# Patient Record
Sex: Male | Born: 2000 | Race: Black or African American | Hispanic: Yes | Marital: Single | Smoking: Never smoker
Health system: Southern US, Community
[De-identification: ages and names within clinical notes are randomized; demographics above are authoritative.]

## PROBLEM LIST (undated history)

## (undated) DIAGNOSIS — R51 Headache: Secondary | ICD-10-CM

## (undated) DIAGNOSIS — R011 Cardiac murmur, unspecified: Secondary | ICD-10-CM

## (undated) DIAGNOSIS — J45909 Unspecified asthma, uncomplicated: Secondary | ICD-10-CM

## (undated) DIAGNOSIS — G43909 Migraine, unspecified, not intractable, without status migrainosus: Secondary | ICD-10-CM

## (undated) HISTORY — PX: CIRCUMCISION: SUR203

## (undated) HISTORY — DX: Headache: R51

## (undated) HISTORY — DX: Cardiac murmur, unspecified: R01.1

---
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
## 2019-05-18 ENCOUNTER — Encounter (HOSPITAL_COMMUNITY): Payer: Self-pay | Admitting: Student

## 2019-05-18 ENCOUNTER — Other Ambulatory Visit: Payer: Self-pay

## 2019-05-18 ENCOUNTER — Emergency Department (HOSPITAL_COMMUNITY)
Admission: EM | Admit: 2019-05-18 | Discharge: 2019-05-18 | Disposition: A | Discharge status: Home / Self Care | Payer: BC Managed Care – PPO | Attending: Emergency Medicine | Admitting: Emergency Medicine

## 2019-05-18 ENCOUNTER — Emergency Department (HOSPITAL_COMMUNITY): Payer: BC Managed Care – PPO

## 2019-05-18 DIAGNOSIS — Z20828 Contact with and (suspected) exposure to other viral communicable diseases: Secondary | ICD-10-CM | POA: Insufficient documentation

## 2019-05-18 DIAGNOSIS — J45909 Unspecified asthma, uncomplicated: Secondary | ICD-10-CM | POA: Diagnosis not present

## 2019-05-18 DIAGNOSIS — B349 Viral infection, unspecified: Secondary | ICD-10-CM | POA: Insufficient documentation

## 2019-05-18 DIAGNOSIS — Z79899 Other long term (current) drug therapy: Secondary | ICD-10-CM | POA: Diagnosis not present

## 2019-05-18 DIAGNOSIS — R519 Headache, unspecified: Secondary | ICD-10-CM | POA: Diagnosis present

## 2019-05-18 LAB — COMPREHENSIVE METABOLIC PANEL
ALT: 19 U/L (ref 0–44)
AST: 24 U/L (ref 15–41)
Albumin: 4.8 g/dL (ref 3.5–5.0)
Alkaline Phosphatase: 102 U/L (ref 38–126)
Anion gap: 8 (ref 5–15)
BUN: 12 mg/dL (ref 6–20)
CO2: 28 mmol/L (ref 22–32)
Calcium: 9.8 mg/dL (ref 8.9–10.3)
Chloride: 101 mmol/L (ref 98–111)
Creatinine, Ser: 0.87 mg/dL (ref 0.61–1.24)
GFR calc Af Amer: >60 mL/min (ref >60)
GFR calc non Af Amer: >60 mL/min (ref >60)
Glucose, Bld: 90 mg/dL (ref 70–99)
Potassium: 3.7 mmol/L (ref 3.5–5.1)
Sodium: 137 mmol/L (ref 135–145)
Total Bilirubin: 1.1 mg/dL (ref 0.3–1.2)
Total Protein: 8.1 g/dL (ref 6.5–8.1)

## 2019-05-18 LAB — CBC WITH DIFFERENTIAL/PLATELET
Abs Immature Granulocytes: 0.01 10*3/uL (ref 0.00–0.07)
Basophils Absolute: 0 10*3/uL (ref 0.0–0.1)
Basophils Relative: 0 %
Eosinophils Absolute: 0.1 10*3/uL (ref 0.0–0.5)
Eosinophils Relative: 1 %
HCT: 48.5 % (ref 39.0–52.0)
Hemoglobin: 15.1 g/dL (ref 13.0–17.0)
Immature Granulocytes: 0 %
Lymphocytes Relative: 18 %
Lymphs Abs: 1.3 10*3/uL (ref 0.7–4.0)
MCH: 23.3 pg — ABNORMAL LOW (ref 26.0–34.0)
MCHC: 31.1 g/dL (ref 30.0–36.0)
MCV: 75 fL — ABNORMAL LOW (ref 80.0–100.0)
Monocytes Absolute: 0.5 10*3/uL (ref 0.1–1.0)
Monocytes Relative: 7 %
Neutro Abs: 5 10*3/uL (ref 1.7–7.7)
Neutrophils Relative %: 74 %
Platelets: 203 10*3/uL (ref 150–400)
RBC: 6.47 MIL/uL — ABNORMAL HIGH (ref 4.22–5.81)
RDW: 13.7 % (ref 11.5–15.5)
WBC: 6.8 10*3/uL (ref 4.0–10.5)
nRBC: 0 % (ref 0.0–0.2)

## 2019-05-18 MED ORDER — ALBUTEROL SULFATE HFA 108 (90 BASE) MCG/ACT IN AERS
2.0000 | INHALATION_SPRAY | Freq: Once | RESPIRATORY_TRACT | Status: AC
  Administered 2019-05-18: 2 via RESPIRATORY_TRACT
  Filled 2019-05-18: qty 6.7

## 2019-05-18 MED ORDER — ACETAMINOPHEN 325 MG PO TABS
650.0000 mg | ORAL_TABLET | Freq: Once | ORAL | Status: DC
  Filled 2019-05-18: qty 2

## 2019-05-18 MED ORDER — DEXAMETHASONE SODIUM PHOSPHATE 10 MG/ML IJ SOLN
10.0000 mg | Freq: Once | INTRAMUSCULAR | Status: AC
  Administered 2019-05-18: 10 mg via INTRAVENOUS
  Filled 2019-05-18: qty 1

## 2019-05-18 MED ORDER — SODIUM CHLORIDE 0.9 % IV BOLUS
500.0000 mL | Freq: Once | INTRAVENOUS | Status: AC
  Administered 2019-05-18: 500 mL via INTRAVENOUS

## 2019-05-18 MED ORDER — AEROCHAMBER PLUS FLO-VU MEDIUM MISC
1.0000 | Freq: Once | Status: AC
  Administered 2019-05-18: 15:00:00 1
  Filled 2019-05-18: qty 1

## 2019-05-18 MED ORDER — ONDANSETRON HCL 4 MG/2ML IJ SOLN
4.0000 mg | Freq: Once | INTRAMUSCULAR | Status: AC
  Administered 2019-05-18: 15:00:00 4 mg via INTRAVENOUS
  Filled 2019-05-18: qty 2

## 2019-05-18 MED ORDER — ONDANSETRON 4 MG PO TBDP: 4.0000 mg | ORAL_TABLET | Freq: Three times a day (TID) | ORAL | 0 refills | Status: DC | PRN

## 2019-05-18 MED ORDER — AEROCHAMBER PLUS FLO-VU LARGE MISC
Status: AC
  Administered 2019-05-18: 1
  Filled 2019-05-18: qty 1

## 2019-05-18 NOTE — ED Notes (Signed)
Patient refused strep swab

## 2019-05-18 NOTE — ED Triage Notes (Signed)
Pt here for evaluation of three days of headache and sore throat, some diarrhea yesterday. Pt sts his mother had covid earlier this month and his job has informed them of positive cases.

## 2019-05-18 NOTE — ED Notes (Signed)
Pt tolerating fluids.   

## 2019-05-18 NOTE — ED Provider Notes (Signed)
[facility]   CSN: [id number] Arrival date & time: 05/18/19  1251     History   Chief Complaint "Covid symptoms"   HPI Terrence [name] is a 18 y.o. male with a hx of asthma & migraines who presents to the ED with complaints of "covid symptoms" that began 3 days prior. Patient reports congestion, nasal congestion, headaches (gradual onset similar to prior), sore throat, productive cough, mild dyspnea at times, nausea, 3 episodes of emesis, & a few episodes of diarrhea. Somewhat poor appetite/PO intake. No alleviating/aggravating factors. He is concerned for COVID 19 as his mother had this earlier in the month and he was around her, there were also some positive cases at his place of work. Denies fever, ear pain, chest pain, abdominal pain, hematemesis, melena, hematochezia, dysuria, or body aches. Denies wheezing, states this does not feel like his prior asthma issues, has not used his inhaler.     HPI  Past Medical History:  Diagnosis Date  . Asthma   . Headache(784.0)   . Heart murmur    has history of PFO  . Migraines     Patient Active Problem List   Diagnosis Date Noted  . History of asthma 11/22/2013  . Migraine without aura and without status migrainosus, not intractable 11/23/2012  . Tension headache 11/23/2012  . Heart murmur 11/23/2012  . 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 CIRCUMCISION    . MOUTH SURGERY  2008        Home Medications    Prior to Admission medications   Medication Sig Start Date End Date Taking? Authorizing Provider  acetaminophen (TYLENOL) 325 MG tablet Take 650 mg by mouth every 4 (four) hours as needed.     [provider]  albuterol (PROVENTIL HFA;VENTOLIN HFA) 108 (90 BASE) MCG/ACT inhaler Inhale 2 puffs into the lungs every 6 (six) hours as needed for wheezing.    [provider]  cetirizine (ZYRTEC) 1  MG/ML syrup Take 10 mg by mouth daily.  11/01/12   [provider]  divalproex (DEPAKOTE SPRINKLE) 125 MG capsule Take 4 capsules by mouth in the evening 04/30/16   Elveria RisingGoodpasture, Tina, NP  fluticasone (FLONASE) 50 MCG/ACT nasal spray Place 1 spray into both nostrils daily. Instill 2 sprays in each nostril daily 11/01/12   [provider]  ibuprofen (CHILDRENS IBUPROFEN) 100 MG/5ML suspension Take 400 mg by mouth every 8 (eight) hours.  07/06/13   [provider]  ondansetron (ZOFRAN ODT) 4 MG disintegrating tablet Place 1 tablet under the tongue every 8 hours as needed for nausea & vomiting 04/30/16   Elveria RisingGoodpasture, Tina, NP  SUMAtriptan (IMITREX) 5 MG/ACT nasal spray Place 1 spray (5 mg total) into the nose every 2 (two) hours as needed. 04/30/16   Elveria RisingGoodpasture, Tina, NP    Family History Family History  Problem Relation Age of Onset  . Migraines Father   . Autism Cousin        3 Paternal Cousins have Autism    Social History Social History   Tobacco Use  . Smoking status: Never Smoker  . Smokeless tobacco: Never Used  Substance Use Topics  . Alcohol use: No  . Drug use: No     Allergies   Patient has no known allergies.   Review of Systems Review of Systems  Constitutional: Positive for appetite change and chills. Negative for fever.  HENT: Positive for  congestion and sore throat. Negative for ear pain.   Respiratory: Positive for cough and shortness of breath. Negative for wheezing.   Cardiovascular: Negative for chest pain and leg swelling.  Gastrointestinal: Positive for diarrhea, nausea and vomiting. Negative for abdominal pain, anal bleeding, blood in stool and constipation.  Genitourinary: Negative for dysuria.  Musculoskeletal: Negative for myalgias.  All other systems reviewed and are negative.  Physical Exam Updated Vital Signs BP (!) 131/59 (BP Location: Right Arm)   Pulse 62   Temp 98.2 F (36.8 C) (Oral)   Resp 20   SpO2 99%    Physical Exam Vitals signs and nursing note reviewed.  Constitutional:      General: He is not in acute distress.    Appearance: He is well-developed. He is not toxic-appearing.  HENT:     Head: Normocephalic and atraumatic.     Right Ear: Ear canal normal. Tympanic membrane is not perforated, erythematous, retracted or bulging.     Left Ear: Ear canal normal. Tympanic membrane is not perforated, erythematous, retracted or bulging.     Ears:     Comments: No mastoid erythema/swelling/tenderness.     Nose: Congestion present.     Right Sinus: No maxillary sinus tenderness or frontal sinus tenderness.     Left Sinus: No maxillary sinus tenderness or frontal sinus tenderness.     Mouth/Throat:     Pharynx: Uvula midline. Posterior oropharyngeal erythema present. No uvula swelling.     Tonsils: No tonsillar exudate. 3+ on the right. 3+ on the left.     Comments: Posterior oropharynx is symmetric appearing. Patient tolerating own secretions without difficulty. No trismus. No drooling. No hot potato voice. No swelling beneath the tongue, submandibular compartment is soft.  Eyes:     General:        Right eye: No discharge.        Left eye: No discharge.     Extraocular Movements: Extraocular movements intact.     Conjunctiva/sclera: Conjunctivae normal.     Pupils: Pupils are equal, round, and reactive to light.  Neck:     Musculoskeletal: Neck supple. No neck rigidity.  Cardiovascular:     Rate and Rhythm: Normal rate and regular rhythm.  Pulmonary:     Effort: Pulmonary effort is normal. No respiratory distress.     Breath sounds: Normal breath sounds. No wheezing, rhonchi or rales.  Abdominal:     General: There is no distension.     Palpations: Abdomen is soft.     Tenderness: There is no abdominal tenderness. There is no guarding or rebound.  Lymphadenopathy:     Cervical: No cervical adenopathy.  Skin:    General: Skin is warm and dry.     Findings: No rash.  Neurological:      General: No focal deficit present.     Mental Status: He is alert.     Comments: Clear speech.   Psychiatric:        Behavior: Behavior normal.    ED Treatments / Results  Labs (all labs ordered are listed, but only abnormal results are displayed) Labs Reviewed  CBC WITH DIFFERENTIAL/PLATELET - Abnormal; Notable for the following components:      Result Value   RBC 6.47 (*)    MCV 75.0 (*)    MCH 23.3 (*)    All other components within normal limits  NOVEL CORONAVIRUS, NAA (HOSP ORDER, SEND-OUT TO REF LAB; TAT 18-24 HRS)  COMPREHENSIVE METABOLIC PANEL  EKG None  Radiology Dg Chest Port 1 View  Result Date: 05/18/2019 CLINICAL DATA:  Cough, diarrhea, headache and sore throat. EXAM: PORTABLE CHEST 1 VIEW COMPARISON:  None. FINDINGS: The heart size and mediastinal contours are within normal limits. Both lungs are clear. The visualized skeletal structures are unremarkable. IMPRESSION: Normal exam. Electronically Signed   By: Francene Boyers M.D.   On: 05/18/2019 14:36    Procedures Procedures (including critical care time)  Medications Ordered in ED Medications  acetaminophen (TYLENOL) tablet 650 mg (650 mg Oral Refused 05/18/19 1450)  sodium chloride 0.9 % bolus 500 mL (0 mLs Intravenous Stopped 05/18/19 1531)  ondansetron (ZOFRAN) injection 4 mg (4 mg Intravenous Given 05/18/19 1459)  albuterol (VENTOLIN HFA) 108 (90 Base) MCG/ACT inhaler 2 puff (2 puffs Inhalation Given 05/18/19 1500)  dexamethasone (DECADRON) injection 10 mg (10 mg Intravenous Given 05/18/19 1459)  AeroChamber Plus Flo-Vu Medium MISC 1 each (1 each Other Given 05/18/19 1500)     Initial Impression / Assessment and Plan / ED Course  I have reviewed the triage vital signs and the nursing notes.  Pertinent labs & imaging results that were available during my care of the patient were reviewed by me and considered in my medical decision making (see chart for details).   Patient presents to the ED  with symptoms likely related to viral illness.  He is nontoxic appearing, in no apparent distress, vitals without significant abnormality.  No signs of AOM/AOE/mastoiditis. Oropharynx with 3+ symmetric tonsils, tolerating secretions w/o difficulty, no trismus, exam not consistent w/ PTA/RPA, Centor score 1- offered strep swab patient declined, this seems less likely. No sinus tenderness, afebrile, sxs < 10 days- doubt acute bacterial sinusitis. No meningismus. Lungs CTA, no wheezing, does not seem consistent w/ asthma exacerbation requiring course of steroids, given inhaler & decadron here more to help with symptomatic relief. CXR w/o infiltrate- doubt CAP. Abdomen w/o peritoneal signs. No headache red flags.  Labs reviewed & fairly unremarkable. Suspect viral. Covid testing pending. Able to tolerate PO and feeling somewhat improved in the ED. Will discharge home with instructions for supportive care & quarantine. I discussed results, treatment plan, need for follow-up, and return precautions with the patient. Provided opportunity for questions, patient confirmed understanding and is in agreement with plan.    Thi Sisemore was evaluated in Emergency Department on 05/18/2019 for the symptoms described in the history of present illness. He/she was evaluated in the context of the global COVID-19 pandemic, which necessitated consideration that the patient might be at risk for infection with the SARS-CoV-2 virus that causes COVID-19. Institutional protocols and algorithms that pertain to the evaluation of patients at risk for COVID-19 are in a state of rapid change based on information released by regulatory bodies including the CDC and federal and state organizations. These policies and algorithms were followed during the patient's care in the ED.  Final Clinical Impressions(s) / ED Diagnoses   Final diagnoses:  Viral illness    ED Discharge Orders         Ordered    ondansetron (ZOFRAN ODT) 4 MG  disintegrating tablet  Every 8 hours PRN     05/18/19 1634           Sutton Plake, Pleas Koch, PA-C 05/18/19 1643    Gerhard Munch, MD 05/19/19 [phone]

## 2019-05-18 NOTE — ED Notes (Signed)
Patient verbalizes understanding of discharge instructions. Opportunity for questioning and answers were provided. Armband removed by staff, pt discharged from ED ambulatory.   

## 2019-05-18 NOTE — Discharge Instructions (Addendum)
Your chest xray was normal Your labs did not show substantial abnormalities.  You declined your strep test.  We have tested you for COVID 19- we will call you if results are positive within 72 hours.  We are instructing patients with covid 19, symptoms of this, or an exposure to quarantine themselves for 14 days. You may be able to discontinue self quarantine if the following conditions are met:   Persons with COVID-19 who have symptoms and were directed to care for themselves at home may discontinue home isolation under the  following conditions: - It has been at least 7 days have passed since symptoms first appeared. - AND at least 3 days (72 hours) have passed since recovery defined as resolution of fever without the use of fever-reducing medications and improvement in respiratory symptoms (e.g., cough, shortness of breath)  Please follow the below quarantine instructions.   Please take motrin/tylenol as needed for discomfort.  Please use your inhaler as needed for wheezing/trouble breathing.   We are sending in a prescription for zofran to take as needed for nausea/vomiting every 8 hours.  Follow attached diet recommendations for diarrhea.   Please follow up with primary care within 1 week for re-evaluation- call prior to going to the office to make them aware of your symptoms. Return to the ER for new or worsening symptoms including but not limited to increased work of breathing, fever, chest pain, passing out, inability to keep fluids down, or any other concerns.       Person Under Monitoring Name: Terrence [name]  Location: [address]  [id number] [place] [id number]   Infection Prevention Recommendations for Individuals Confirmed to have, or Being Evaluated for, 2019 Novel Coronavirus (COVID-19) Infection Who Receive Care at Home  Individuals who are confirmed to have, or are being evaluated for, COVID-19 should follow the prevention steps below until a healthcare provider  or local or state health department says they can return to normal activities.  Stay home except to get medical care You should restrict activities outside your home, except for getting medical care. Do not go to work, school, or public areas, and do not use public transportation or taxis.  Call ahead before visiting your doctor Before your medical appointment, call the healthcare provider and tell them that you have, or are being evaluated for, COVID-19 infection. This will help the healthcare providers office take steps to keep other people from getting infected. Ask your healthcare provider to call the local or state health department.  Monitor your symptoms Seek prompt medical attention if your illness is worsening (e.g., difficulty breathing). Before going to your medical appointment, call the healthcare provider and tell them that you have, or are being evaluated for, COVID-19 infection. Ask your healthcare provider to call the local or state health department.  Wear a facemask You should wear a facemask that covers your nose and mouth when you are in the same room with other people and when you visit a healthcare provider. People who live with or visit you should also wear a facemask while they are in the same room with you.  Separate yourself from other people in your home As much as possible, you should stay in a different room from other people in your home. Also, you should use a separate bathroom, if available.  Avoid sharing household items You should not share dishes, drinking glasses, cups, eating utensils, towels, bedding, or other items with other people in your home. After using these  items, you should wash them thoroughly with soap and water.  Cover your coughs and sneezes Cover your mouth and nose with a tissue when you cough or sneeze, or you can cough or sneeze into your sleeve. Throw used tissues in a lined trash can, and immediately wash your hands with soap  and water for at least 20 seconds or use an alcohol-based hand rub.  Wash your [facility] your hands often and thoroughly with soap and water for at least 20 seconds. You can use an alcohol-based hand sanitizer if soap and water are not available and if your hands are not visibly dirty. Avoid touching your eyes, nose, and mouth with unwashed hands.   Prevention Steps for Caregivers and Household Members of Individuals Confirmed to have, or Being Evaluated for, COVID-19 Infection Being Cared for in the Home  If you live with, or provide care at home for, a person confirmed to have, or being evaluated for, COVID-19 infection please follow these guidelines to prevent infection:  Follow healthcare providers instructions Make sure that you understand and can help the patient follow any healthcare provider instructions for all care.  Provide for the patients basic needs You should help the patient with basic needs in the home and provide support for getting groceries, prescriptions, and other personal needs.  Monitor the patients symptoms If they are getting sicker, call his or her medical provider and tell them that the patient has, or is being evaluated for, COVID-19 infection. This will help the healthcare providers office take steps to keep other people from getting infected. Ask the healthcare provider to call the local or state health department.  Limit the number of people who have contact with the patient If possible, have only one caregiver for the patient. Other household members should stay in another home or place of residence. If this is not possible, they should stay in another room, or be separated from the patient as much as possible. Use a separate bathroom, if available. Restrict visitors who do not have an essential need to be in the home.  Keep older adults, very young children, and other sick people away from the patient Keep older adults, very young children, and  those who have compromised immune systems or chronic health conditions away from the patient. This includes people with chronic heart, lung, or kidney conditions, diabetes, and cancer.  Ensure good ventilation Make sure that shared spaces in the home have good air flow, such as from an air conditioner or an opened window, weather permitting.  Wash your hands often Wash your hands often and thoroughly with soap and water for at least 20 seconds. You can use an alcohol based hand sanitizer if soap and water are not available and if your hands are not visibly dirty. Avoid touching your eyes, nose, and mouth with unwashed hands. Use disposable paper towels to dry your hands. If not available, use dedicated cloth towels and replace them when they become wet.  Wear a facemask and gloves Wear a disposable facemask at all times in the room and gloves when you touch or have contact with the patients blood, body fluids, and/or secretions or excretions, such as sweat, saliva, sputum, nasal mucus, vomit, urine, or feces.  Ensure the mask fits over your nose and mouth tightly, and do not touch it during use. Throw out disposable facemasks and gloves after using them. Do not reuse. Wash your hands immediately after removing your facemask and gloves. If your personal clothing becomes  contaminated, carefully remove clothing and launder. Wash your hands after handling contaminated clothing. Place all used disposable facemasks, gloves, and other waste in a lined container before disposing them with other household waste. Remove gloves and wash your hands immediately after handling these items.  Do not share dishes, glasses, or other household items with the patient Avoid sharing household items. You should not share dishes, drinking glasses, cups, eating utensils, towels, bedding, or other items with a patient who is confirmed to have, or being evaluated for, COVID-19 infection. After the person uses these  items, you should wash them thoroughly with soap and water.  Wash laundry thoroughly Immediately remove and wash clothes or bedding that have blood, body fluids, and/or secretions or excretions, such as sweat, saliva, sputum, nasal mucus, vomit, urine, or feces, on them. Wear gloves when handling laundry from the patient. Read and follow directions on labels of laundry or clothing items and detergent. In general, wash and dry with the warmest temperatures recommended on the label.  Clean all areas the individual has used often Clean all touchable surfaces, such as counters, tabletops, doorknobs, bathroom fixtures, toilets, phones, keyboards, tablets, and bedside tables, every day. Also, clean any surfaces that may have blood, body fluids, and/or secretions or excretions on them. Wear gloves when cleaning surfaces the patient has come in contact with. Use a diluted bleach solution (e.g., dilute bleach with 1 part bleach and 10 parts water) or a household disinfectant with a label that says EPA-registered for coronaviruses. To make a bleach solution at home, add 1 tablespoon of bleach to 1 quart (4 cups) of water. For a larger supply, add  cup of bleach to 1 gallon (16 cups) of water. Read labels of cleaning products and follow recommendations provided on product labels. Labels contain instructions for safe and effective use of the cleaning product including precautions you should take when applying the product, such as wearing gloves or eye protection and making sure you have good ventilation during use of the product. Remove gloves and wash hands immediately after cleaning.  Monitor yourself for signs and symptoms of illness Caregivers and household members are considered close contacts, should monitor their health, and will be asked to limit movement outside of the home to the extent possible. Follow the monitoring steps for close contacts listed on the symptom monitoring form.   ? If you have  additional questions, contact your local health department or call the epidemiologist on call at [phone] (available 24/7). ? This guidance is subject to change. For the most up-to-date guidance from [facility], please refer to their website: YouBlogs.pl

## 2019-05-19 LAB — NOVEL CORONAVIRUS, NAA (HOSP ORDER, SEND-OUT TO REF LAB; TAT 18-24 HRS): SARS-CoV-2, NAA: NOT DETECTED

## 2019-05-20 ENCOUNTER — Telehealth (HOSPITAL_COMMUNITY): Payer: Self-pay

## 2020-01-22 ENCOUNTER — Other Ambulatory Visit: Payer: Self-pay

## 2020-01-22 ENCOUNTER — Emergency Department (HOSPITAL_COMMUNITY)
Admission: EM | Admit: 2020-01-22 | Discharge: 2020-01-22 | Disposition: A | Discharge status: Home / Self Care | Payer: BC Managed Care – PPO | Attending: Emergency Medicine | Admitting: Emergency Medicine

## 2020-01-22 DIAGNOSIS — R5383 Other fatigue: Secondary | ICD-10-CM | POA: Insufficient documentation

## 2020-01-22 DIAGNOSIS — R519 Headache, unspecified: Secondary | ICD-10-CM | POA: Insufficient documentation

## 2020-01-22 DIAGNOSIS — R0981 Nasal congestion: Secondary | ICD-10-CM | POA: Insufficient documentation

## 2020-01-22 DIAGNOSIS — R05 Cough: Secondary | ICD-10-CM | POA: Insufficient documentation

## 2020-01-22 DIAGNOSIS — Z5321 Procedure and treatment not carried out due to patient leaving prior to being seen by health care provider: Secondary | ICD-10-CM | POA: Insufficient documentation

## 2020-01-22 NOTE — ED Notes (Signed)
Pt called for vital signs with no response x3.

## 2020-01-22 NOTE — ED Triage Notes (Signed)
Pt reports 2-3 days of cough, nasal congestion, fatigue, and headache after being around someone at work that has covid.

## 2020-04-13 ENCOUNTER — Ambulatory Visit
Admission: EM | Admit: 2020-04-13 | Discharge: 2020-04-13 | Disposition: A | Discharge status: Home / Self Care | Payer: Self-pay | Attending: Emergency Medicine | Admitting: Emergency Medicine

## 2020-04-13 ENCOUNTER — Other Ambulatory Visit: Payer: Self-pay

## 2020-04-13 ENCOUNTER — Encounter: Payer: Self-pay | Admitting: Emergency Medicine

## 2020-04-13 DIAGNOSIS — Z20822 Contact with and (suspected) exposure to covid-19: Secondary | ICD-10-CM | POA: Insufficient documentation

## 2020-04-13 DIAGNOSIS — J351 Hypertrophy of tonsils: Secondary | ICD-10-CM | POA: Insufficient documentation

## 2020-04-13 DIAGNOSIS — J069 Acute upper respiratory infection, unspecified: Secondary | ICD-10-CM | POA: Insufficient documentation

## 2020-04-13 LAB — POCT RAPID STREP A (OFFICE): Rapid Strep A Screen: NEGATIVE

## 2020-04-13 MED ORDER — DEXAMETHASONE 10 MG/ML FOR PEDIATRIC ORAL USE
10.0000 mg | Freq: Once | INTRAMUSCULAR | Status: AC
  Administered 2020-04-13: 10 mg via ORAL

## 2020-04-13 NOTE — ED Provider Notes (Signed)
[facility]    CSN: [id number] Arrival date & time: 04/13/20  1004      History   Chief Complaint Chief Complaint  Patient presents with  . Cough    HPI Terrence [name] is a 19 y.o. male.  Presenting for Covid testing: Exposure: nephew Date of exposure: daily Any fever, symptoms since exposure: Yes-cough, nasal congestion.  Denies chest pain, fever, shortness of breath, nausea or vomiting, diarrhea.  Also endorsing worsening sore throat with bilateral ear pain that occurs when swallowing.  Has had some difficulty breathing second to tonsillar swelling.  Endorsing history of tonsillar hypertrophy: Feels stable.  No drooling or dental pain.  Past Medical History:  Diagnosis Date  . Asthma   . Headache(784.0)   . Heart murmur    has history of PFO  . Migraines     Patient Active Problem List   Diagnosis Date Noted  . History of asthma 11/22/2013  . Migraine without aura and without status migrainosus, not intractable 11/23/2012  . Tension headache 11/23/2012  . Heart murmur 11/23/2012  . 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 CIRCUMCISION    . MOUTH SURGERY  2008       Home Medications    Prior to Admission medications   Medication Sig Start Date End Date Taking? Authorizing Provider  acetaminophen (TYLENOL) 325 MG tablet Take 650 mg by mouth every 4 (four) hours as needed for mild pain.     [provider]  albuterol (PROVENTIL HFA;VENTOLIN HFA) 108 (90 BASE) MCG/ACT inhaler Inhale 2 puffs into the lungs every 6 (six) hours as needed for wheezing.    [provider]  cetirizine (ZYRTEC) 1 MG/ML syrup Take 10 mg by mouth daily as needed (allergies).  11/01/12   [provider]  divalproex (DEPAKOTE SPRINKLE) 125 MG capsule Take 4 capsules by mouth in the evening 04/30/16   Elveria Rising, NP  fluticasone (FLONASE) 50 MCG/ACT nasal spray Place 1-2 sprays into  both nostrils daily as needed for allergies or rhinitis.  11/01/12   [provider]  ibuprofen (CHILDRENS IBUPROFEN) 100 MG/5ML suspension Take 400 mg by mouth every 8 (eight) hours as needed for mild pain.  07/06/13   [provider]  ondansetron (ZOFRAN ODT) 4 MG disintegrating tablet Take 1 tablet (4 mg total) by mouth every 8 (eight) hours as needed for nausea or vomiting. 05/18/19   Petrucelli, Pleas Koch, PA-C    Family History Family History  Problem Relation Age of Onset  . Migraines Father   . Autism Cousin        3 Paternal Cousins have Autism    Social History Social History   Tobacco Use  . Smoking status: Never Smoker  . Smokeless tobacco: Never Used  Substance Use Topics  . Alcohol use: No  . Drug use: No     Allergies   Patient has no known allergies.   Review of Systems As per HPI   Physical Exam Triage Vital Signs ED Triage Vitals  Enc Vitals Group     BP      Pulse      Resp      Temp      Temp src      SpO2      Weight      Height      Head Circumference      Peak Flow      Pain  Score      Pain Loc      Pain Edu?      Excl. in GC?    No data found.  Updated Vital Signs BP (!) 156/71 (BP Location: Right Arm)   Pulse 60   Temp 98.1 F (36.7 C) (Oral)   Resp 18   SpO2 99%   Visual Acuity Right Eye Distance:   Left Eye Distance:   Bilateral Distance:    Right Eye Near:   Left Eye Near:    Bilateral Near:     Physical Exam Constitutional:      General: He is not in acute distress.    Appearance: He is not toxic-appearing or diaphoretic.  HENT:     Head: Normocephalic and atraumatic.     Right Ear: Tympanic membrane and ear canal normal.     Left Ear: Tympanic membrane and ear canal normal.     Nose: Nose normal.     Mouth/Throat:     Mouth: Mucous membranes are moist.     Pharynx: Oropharynx is clear. No oropharyngeal exudate or posterior oropharyngeal erythema.     Comments: 4+ tonsils bilaterally  without uvular swelling.  Uvula is midline. Eyes:     General: No scleral icterus.    Conjunctiva/sclera: Conjunctivae normal.     Pupils: Pupils are equal, round, and reactive to light.  Neck:     Comments: Trachea midline, negative JVD Cardiovascular:     Rate and Rhythm: Normal rate and regular rhythm.  Pulmonary:     Effort: Pulmonary effort is normal. No respiratory distress.     Breath sounds: No wheezing.  Musculoskeletal:     Cervical back: Neck supple. No tenderness.  Lymphadenopathy:     Cervical: Cervical adenopathy present.  Skin:    Capillary Refill: Capillary refill takes less than 2 seconds.     Coloration: Skin is not jaundiced or pale.     Findings: No rash.  Neurological:     Mental Status: He is alert and oriented to person, place, and time.      UC Treatments / Results  Labs (all labs ordered are listed, but only abnormal results are displayed) Labs Reviewed  NOVEL CORONAVIRUS, NAA  CULTURE, GROUP A STREP [facility])  POCT RAPID STREP A (OFFICE)    EKG   Radiology No results found.  Procedures Procedures (including critical care time)  Medications Ordered in UC Medications  dexamethasone (DECADRON) 10 MG/ML injection for Pediatric ORAL use 10 mg (has no administration in time range)    Initial Impression / Assessment and Plan / UC Course  I have reviewed the triage vital signs and the nursing notes.  Pertinent labs & imaging results that were available during my care of the patient were reviewed by me and considered in my medical decision making (see chart for details).     Patient afebrile, nontoxic, with SpO2 99%.  Rapid strep negative, culture pending.  Patient does have significant tonsillar hypertrophy.  Feels size of tonsils is stable, though does have some difficulty breathing.  Given Decadron in office which he tolerated well.  Provided contact information for ENT.  Covid PCR pending.  Patient to quarantine until results are back.  We  will treat supportively as outlined below.  Return precautions discussed, patient verbalized understanding and is agreeable to plan. Final Clinical Impressions(s) / UC Diagnoses   Final diagnoses:  Encounter for screening laboratory testing for COVID-19 virus  URI with cough and congestion  Chronic tonsillar  hypertrophy     Discharge Instructions     Your COVID test is pending - it is important to quarantine / isolate at home until your results are back. If you test positive and would like further evaluation for persistent or worsening symptoms, you may schedule an E-visit or virtual (video) visit throughout the [facility] app or website.  PLEASE NOTE: If you develop severe chest pain or shortness of breath please go to the ER or call 9-1-1 for further evaluation --> DO NOT schedule electronic or virtual visits for this. Please call our office for further guidance / recommendations as needed.  For information about the Covid vaccine, please visit [email or web address]    ED Prescriptions    None     PDMP not reviewed this encounter.   Hall-Potvin, [place], [place] 04/13/20 1118

## 2020-04-13 NOTE — ED Triage Notes (Signed)
Pt here for cough and nasal congestion; pt sts covid exposure to his nephew

## 2020-04-13 NOTE — Discharge Instructions (Addendum)
Your COVID test is pending - it is important to quarantine / isolate at home until your results are back. °If you test positive and would like further evaluation for persistent or worsening symptoms, you may schedule an E-visit or virtual (video) visit throughout the [facility] MyChart app or website. ° °PLEASE NOTE: If you develop severe chest pain or shortness of breath please go to the ER or call 9-1-1 for further evaluation --> DO NOT schedule electronic or virtual visits for this. °Please call our office for further guidance / recommendations as needed. ° °For information about the Covid vaccine, please visit [email or web address] °

## 2020-04-14 LAB — NOVEL CORONAVIRUS, NAA: SARS-CoV-2, NAA: NOT DETECTED

## 2020-04-14 LAB — SARS-COV-2, NAA 2 DAY TAT

## 2020-04-16 LAB — CULTURE, GROUP A STREP (THRC)

## 2020-04-19 ENCOUNTER — Emergency Department (HOSPITAL_COMMUNITY)
Admission: EM | Admit: 2020-04-19 | Discharge: 2020-04-20 | Disposition: A | Discharge status: Home / Self Care | Payer: HRSA Program | Attending: Emergency Medicine | Admitting: Emergency Medicine

## 2020-04-19 ENCOUNTER — Other Ambulatory Visit: Payer: Self-pay

## 2020-04-19 DIAGNOSIS — U071 COVID-19: Secondary | ICD-10-CM | POA: Diagnosis not present

## 2020-04-19 DIAGNOSIS — R0981 Nasal congestion: Secondary | ICD-10-CM | POA: Diagnosis present

## 2020-04-19 DIAGNOSIS — J45909 Unspecified asthma, uncomplicated: Secondary | ICD-10-CM | POA: Insufficient documentation

## 2020-04-19 NOTE — ED Triage Notes (Signed)
Pt has been having cough, congestion, fevers, chills, sore throat. Pt has very large tonsils and says he cant eat. Pt said hard to swallow. Tonsils are red and inflamed.

## 2020-04-20 LAB — RESPIRATORY PANEL BY RT PCR (FLU A&B, COVID)
Influenza A by PCR: NEGATIVE
Influenza B by PCR: NEGATIVE
SARS Coronavirus 2 by RT PCR: POSITIVE — AB

## 2020-04-20 LAB — GROUP A STREP BY PCR: Group A Strep by PCR: NOT DETECTED

## 2020-04-20 MED ORDER — LIDOCAINE VISCOUS HCL 2 % MT SOLN
15.0000 mL | Freq: Once | OROMUCOSAL | Status: AC
  Administered 2020-04-20: 15 mL via OROMUCOSAL
  Filled 2020-04-20: qty 15

## 2020-04-20 MED ORDER — DEXAMETHASONE SODIUM PHOSPHATE 10 MG/ML IJ SOLN
10.0000 mg | Freq: Once | INTRAMUSCULAR | Status: AC
  Administered 2020-04-20: 10 mg via INTRAMUSCULAR
  Filled 2020-04-20: qty 1

## 2020-04-20 MED ORDER — LIDOCAINE VISCOUS HCL 2 % MT SOLN: 15.0000 mL | OROMUCOSAL | 0 refills | Status: AC | PRN

## 2020-04-20 NOTE — Discharge Instructions (Signed)
Your COVID-19 test was positive today.  This seems to be the cause of your symptoms. Recommend to continue symptomatic care, quarantine away from others best you can. Follow-up with your primary care doctor. Return here for any new or acute changes--uncontrolled fever, shortness of breath, chest pain, etc.

## 2020-04-20 NOTE — ED Provider Notes (Signed)
[facility] EMERGENCY DEPARTMENT Provider Note   CSN: [id number] Arrival date & time: 04/19/20  2301     History Chief Complaint  Patient presents with  . Cough  . Chills    Terrence [name] is a 19 y.o. male.  The history is provided by the patient and medical records.    19 year old male with history of asthma, migraine headaches, presenting to the ED with 1 week of nasal congestion, mild cough, and sore throat.  States he was around his nephew who was recently sick with COVID-19 but had different symptoms numbness.  He reports his tonsils feel swollen and he has pain with swallowing.  He denies any chest pain or shortness of breath.  He has been running intermittent fevers at home, T-max 102.25F.  No medication taken prior to arrival.  Past Medical History:  Diagnosis Date  . Asthma   . Headache(784.0)   . Heart murmur    has history of PFO  . Migraines     Patient Active Problem List   Diagnosis Date Noted  . History of asthma 11/22/2013  . Migraine without aura and without status migrainosus, not intractable 11/23/2012  . Tension headache 11/23/2012  . Heart murmur 11/23/2012  . 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 CIRCUMCISION    . MOUTH SURGERY  2008       Family History  Problem Relation Age of Onset  . Migraines Father   . Autism Cousin        3 Paternal Cousins have Autism    Social History   Tobacco Use  . Smoking status: Never Smoker  . Smokeless tobacco: Never Used  Substance Use Topics  . Alcohol use: No  . Drug use: No    Home Medications Prior to Admission medications   Medication Sig Start Date End Date Taking? Authorizing Provider  acetaminophen (TYLENOL) 325 MG tablet Take 650 mg by mouth every 4 (four) hours as needed for mild pain.     [provider]  albuterol (PROVENTIL HFA;VENTOLIN HFA) 108 (90 BASE) MCG/ACT inhaler Inhale 2 puffs into  the lungs every 6 (six) hours as needed for wheezing.    [provider]  cetirizine (ZYRTEC) 1 MG/ML syrup Take 10 mg by mouth daily as needed (allergies).  11/01/12   [provider]  divalproex (DEPAKOTE SPRINKLE) 125 MG capsule Take 4 capsules by mouth in the evening 04/30/16   Elveria Rising, NP  fluticasone (FLONASE) 50 MCG/ACT nasal spray Place 1-2 sprays into both nostrils daily as needed for allergies or rhinitis.  11/01/12   [provider]  ibuprofen (CHILDRENS IBUPROFEN) 100 MG/5ML suspension Take 400 mg by mouth every 8 (eight) hours as needed for mild pain.  07/06/13   [provider]  ondansetron (ZOFRAN ODT) 4 MG disintegrating tablet Take 1 tablet (4 mg total) by mouth every 8 (eight) hours as needed for nausea or vomiting. 05/18/19   Petrucelli, Pleas Koch, PA-C    Allergies    Patient has no known allergies.  Review of Systems   Review of Systems  HENT: Positive for congestion and sore throat.   Respiratory: Positive for cough.   All other systems reviewed and are negative.   Physical Exam Updated Vital Signs BP (!) 147/74 (BP Location: Right Arm)   Pulse 72   Temp 98.8 F (37.1 C) (Oral)   Resp 20   SpO2 98%   Physical  Exam Vitals and nursing note reviewed.  Constitutional:      Appearance: He is well-developed.  HENT:     Head: Normocephalic and atraumatic.     Right Ear: Tympanic membrane and ear canal normal.     Left Ear: Tympanic membrane and ear canal normal.     Nose: Congestion present.     Mouth/Throat:     Comments: Tonsils 2+ bilaterally without exudates; uvula midline without evidence of peritonsillar abscess; handling secretions appropriately; no difficulty swallowing or speaking; normal phonation without stridor Eyes:     Conjunctiva/sclera: Conjunctivae normal.     Pupils: Pupils are equal, round, and reactive to light.  Cardiovascular:     Rate and Rhythm: Normal rate and regular rhythm.     Heart  sounds: Normal heart sounds.  Pulmonary:     Effort: Pulmonary effort is normal.     Breath sounds: Normal breath sounds. No wheezing or rhonchi.     Comments: Lungs clear, no distress Abdominal:     General: Bowel sounds are normal.     Palpations: Abdomen is soft.  Musculoskeletal:        General: Normal range of motion.     Cervical back: Normal range of motion.  Skin:    General: Skin is warm and dry.  Neurological:     Mental Status: He is alert and oriented to person, place, and time.     ED Results / Procedures / Treatments   Labs (all labs ordered are listed, but only abnormal results are displayed) Labs Reviewed  RESPIRATORY PANEL BY RT PCR (FLU A&B, COVID) - Abnormal; Notable for the following components:      Result Value   SARS Coronavirus 2 by RT PCR POSITIVE (*)    All other components within normal limits  GROUP A STREP BY PCR    EKG None  Radiology No results found.  Procedures Procedures (including critical care time)  Medications Ordered in ED Medications  dexamethasone (DECADRON) injection 10 mg (10 mg Intramuscular Given 04/20/20 0208)  lidocaine (XYLOCAINE) 2 % viscous mouth solution 15 mL (15 mLs Mouth/Throat Given 04/20/20 0207)    ED Course  I have reviewed the triage vital signs and the nursing notes.  Pertinent labs & imaging results that were available during my care of the patient were reviewed by me and considered in my medical decision making (see chart for details).    MDM Rules/Calculators/A&P  19 year old male here with URI type symptoms.  Has recently been around nephew who was diagnosed with COVID-19.  He is afebrile and nontoxic in appearance here.  Does have 2+ tonsillar edema bilaterally but appears grossly symmetric without uvular deviation.  There are no exudates.  No signs or symptoms concerning for peritonsillar abscess or deep space infection of the neck.  He is handling secretions well, normal phonation without stridor.   Rapid strep is negative but Covid screen is positive.  Given dose of Decadron for tonsillar edema, viscous lidocaine for pain.  Close follow-up with PCP.  Return here for any new/acute changes.  Jaysun Wessels was evaluated in Emergency Department on 04/20/2020 for the symptoms described in the history of present illness. He was evaluated in the context of the global COVID-19 pandemic, which necessitated consideration that the patient might be at risk for infection with the SARS-CoV-2 virus that causes COVID-19. Institutional protocols and algorithms that pertain to the evaluation of patients at risk for COVID-19 are in a state of rapid change based on  information released by regulatory bodies including the CDC and federal and state organizations. These policies and algorithms were followed during the patient's care in the ED.   Final Clinical Impression(s) / ED Diagnoses Final diagnoses:  COVID-19    Rx / DC Orders ED Discharge Orders         Ordered    lidocaine (XYLOCAINE) 2 % solution  As needed        04/20/20 0228           Garlon Hatchet, PA-C 04/20/20 9485    Marily Memos, MD 04/20/20 [phone]

## 2020-04-21 ENCOUNTER — Telehealth: Payer: Self-pay | Admitting: Physician Assistant

## 2020-04-21 ENCOUNTER — Telehealth (HOSPITAL_COMMUNITY): Payer: Self-pay | Admitting: Oncology

## 2020-04-21 ENCOUNTER — Encounter: Payer: Self-pay | Admitting: Oncology

## 2020-04-21 NOTE — Telephone Encounter (Signed)
Re: [facility]  Called to Discuss with patient about Covid symptoms and the use of regeneron, a monoclonal antibody infusion for those with mild to moderate Covid symptoms and at a high risk of hospitalization.     Pt is qualified for this infusion at the [facility] infusion center due to co-morbid conditions and/or a member of an at-risk group.    Past Medical History:  Diagnosis Date  . Asthma   . Headache(784.0)   . Heart murmur    has history of PFO  . Migraines     Specific risk condition-Asthma    Unable to reach pt. Left [facility] and sent a text message.  Symptom onset around 1 week.   Mignon Pine, AGNP-C [phone] (Infusion Center Hotline)

## 2020-04-21 NOTE — Telephone Encounter (Signed)
Called to discuss with Terrence [name] about Covid symptoms and the use of  monoclonal antibody infusion for those with mild to moderate Covid symptoms and at a high risk of hospitalization.     Pt is qualified for this infusion due to co-morbid conditions and/or a member of an at-risk group, however declines infusion at this time.  Symptoms reviewed as well as criteria for ending isolation.  Symptoms reviewed that would warrant ED/Hospital evaluation. Preventative practices reviewed. Patient verbalized understanding. Patient advised to call back if he decides that he does want to get infusion. Callback number to the infusion center given. Patient advised to go to Urgent care or ED with severe symptoms. Last date he would be eligible for infusion is 04/22/20.   Pt is not qualified for this infusion due to lack of identified risk factors and co-morbid conditions.  Symptoms reviewed as well as criteria for ending isolation.  Symptoms reviewed that would warrant ED/Hospital evaluation as well should her condition worsen. Preventative practices reviewed. Patient verbalized understanding.   Patient Active Problem List   Diagnosis Date Noted  . History of asthma 11/22/2013  . Migraine without aura and without status migrainosus, not intractable 11/23/2012  . Tension headache 11/23/2012  . Heart murmur 11/23/2012  . 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

## 2021-03-24 IMAGING — DX DG CHEST 1V PORT
1 series · 1 of 1 positions shown · non-contrast
Comparison: None.

CLINICAL DATA: Cough, diarrhea, headache and sore throat.

EXAM:
PORTABLE CHEST 1 VIEW

[chest ap]
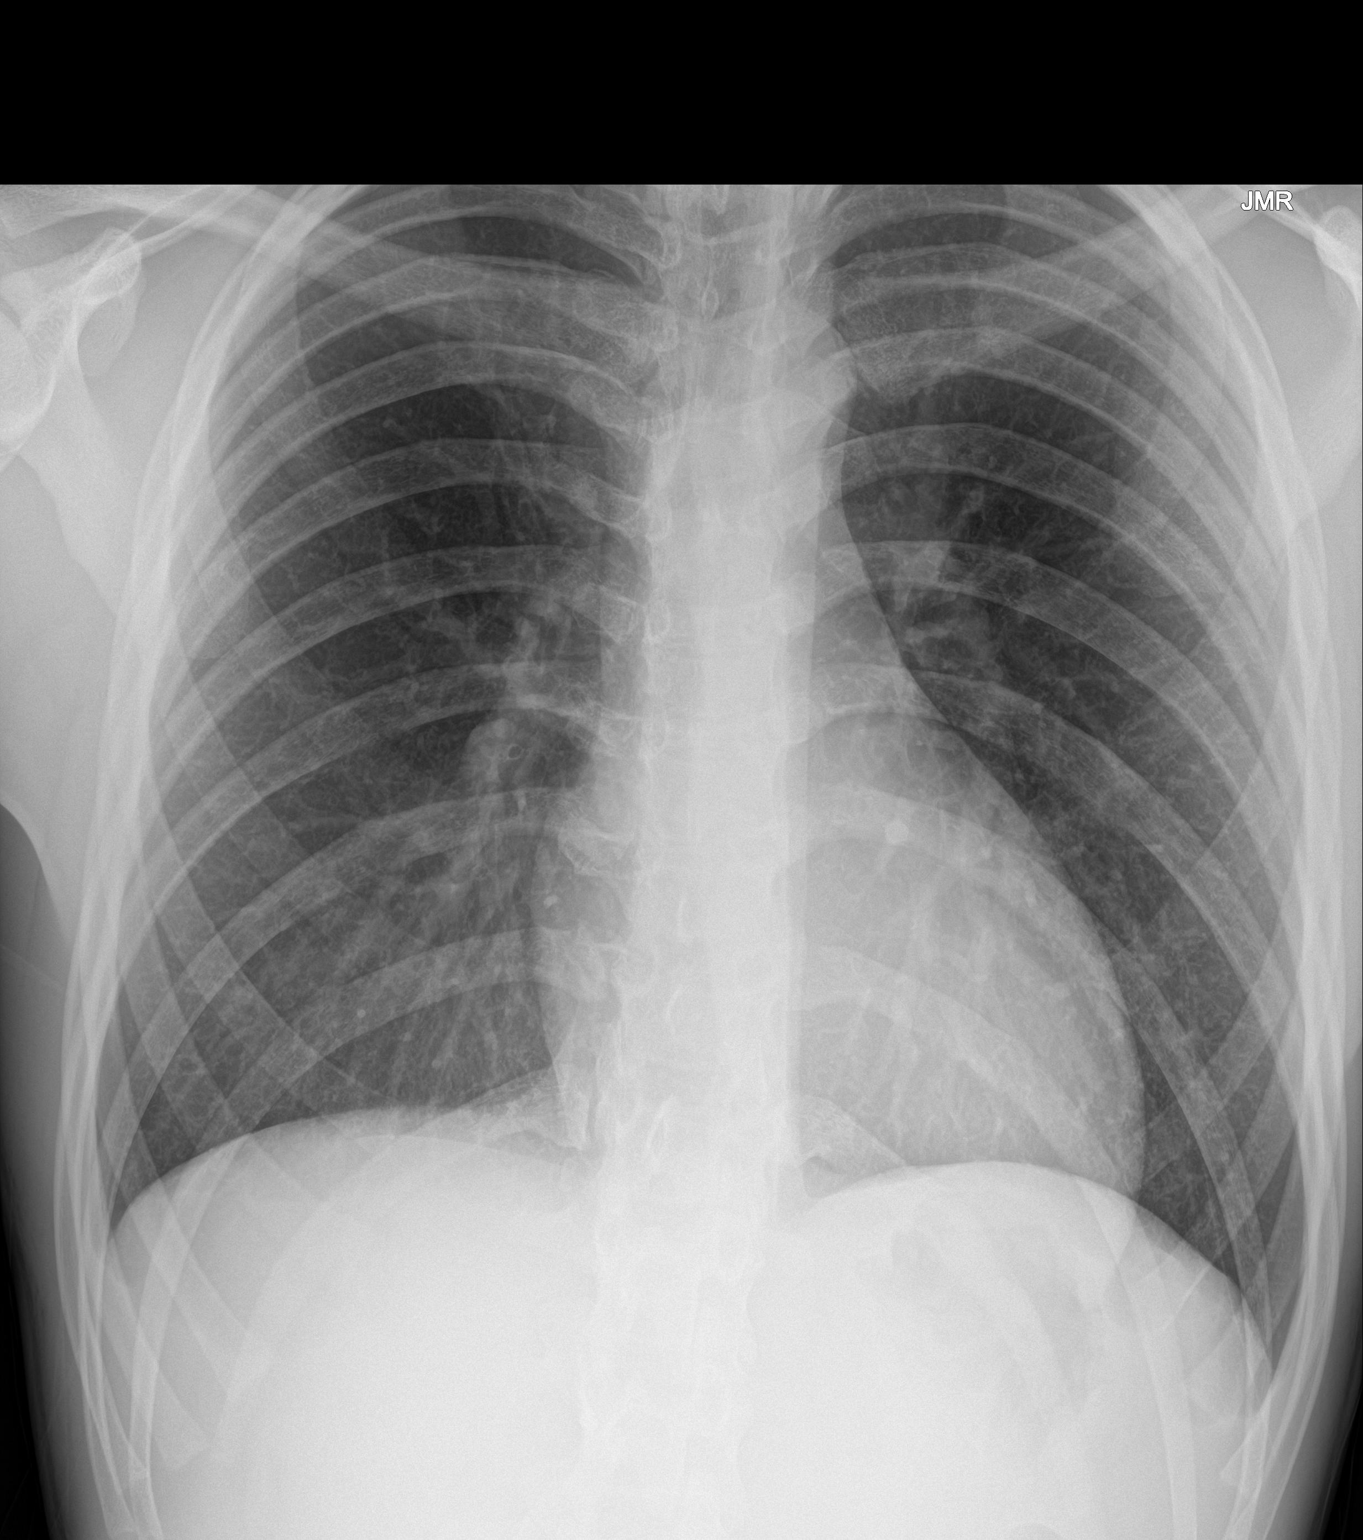

[1 of 1 positions shown; findings below may reference images not displayed]

FINDINGS: The heart size and mediastinal contours are within normal limits.
Both lungs are clear. The visualized skeletal structures are
unremarkable.
IMPRESSION: Normal exam.

## 2022-11-26 ENCOUNTER — Emergency Department (HOSPITAL_BASED_OUTPATIENT_CLINIC_OR_DEPARTMENT_OTHER)
Admission: EM | Admit: 2022-11-26 | Discharge: 2022-11-26 | Disposition: A | Discharge status: Home / Self Care | Payer: Self-pay | Attending: Emergency Medicine | Admitting: Emergency Medicine

## 2022-11-26 ENCOUNTER — Other Ambulatory Visit: Payer: Self-pay

## 2022-11-26 ENCOUNTER — Encounter (HOSPITAL_BASED_OUTPATIENT_CLINIC_OR_DEPARTMENT_OTHER): Payer: Self-pay | Admitting: Emergency Medicine

## 2022-11-26 ENCOUNTER — Emergency Department (HOSPITAL_BASED_OUTPATIENT_CLINIC_OR_DEPARTMENT_OTHER): Payer: Self-pay

## 2022-11-26 DIAGNOSIS — Y9367 Activity, basketball: Secondary | ICD-10-CM | POA: Insufficient documentation

## 2022-11-26 DIAGNOSIS — J45909 Unspecified asthma, uncomplicated: Secondary | ICD-10-CM | POA: Insufficient documentation

## 2022-11-26 DIAGNOSIS — S62024A Nondisplaced fracture of middle third of navicular [scaphoid] bone of right wrist, initial encounter for closed fracture: Secondary | ICD-10-CM | POA: Insufficient documentation

## 2022-11-26 DIAGNOSIS — W19XXXA Unspecified fall, initial encounter: Secondary | ICD-10-CM | POA: Insufficient documentation

## 2022-11-26 NOTE — Discharge Instructions (Addendum)
Leave the splint on at all times until you follow-up with sports medicine.  Call their office today so they can make you appointment most likely on Monday or Tuesday.  Elevate your hand in the meantime.  Use Tylenol or ibuprofen as needed for pain.  Do not use your right hand at all.  No lifting or pushing off with it.

## 2022-11-26 NOTE — ED Triage Notes (Signed)
Right wrist injury while playing basketball yesterday. Swelling/bruising present. States he fell and caught himself with right wrist.

## 2022-11-26 NOTE — ED Provider Notes (Addendum)
[facility] Provider Note   CSN: [id number] Arrival date & time: 11/26/22  4098     History  Chief Complaint  Patient presents with   Wrist Pain    Terrence [name] is a 22 y.o. male.  Patient is a 22 year old male with a history of asthma, migraines and allergies who is presenting today due to persistent right wrist pain.  Patient was playing basketball yesterday and reports that he fell catching himself on that arm.  Since that time he has had bruising and tenderness of the right wrist.  No other injury at that time.  The history is provided by the patient.  Wrist Pain       Home Medications Prior to Admission medications   Medication Sig Start Date End Date Taking? Authorizing Provider  acetaminophen (TYLENOL) 325 MG tablet Take 650 mg by mouth every 4 (four) hours as needed for mild pain.     [provider]  albuterol (PROVENTIL HFA;VENTOLIN HFA) 108 (90 BASE) MCG/ACT inhaler Inhale 2 puffs into the lungs every 6 (six) hours as needed for wheezing.    [provider]  cetirizine (ZYRTEC) 1 MG/ML syrup Take 10 mg by mouth daily as needed (allergies).  11/01/12   [provider]  divalproex (DEPAKOTE SPRINKLE) 125 MG capsule Take 4 capsules by mouth in the evening 04/30/16   Elveria Rising, NP  fluticasone (FLONASE) 50 MCG/ACT nasal spray Place 1-2 sprays into both nostrils daily as needed for allergies or rhinitis.  11/01/12   [provider]  ibuprofen (CHILDRENS IBUPROFEN) 100 MG/5ML suspension Take 400 mg by mouth every 8 (eight) hours as needed for mild pain.  07/06/13   [provider]  lidocaine (XYLOCAINE) 2 % solution Use as directed 15 mLs in the mouth or throat as needed for mouth pain. 04/20/20   Garlon Hatchet, PA-C  ondansetron (ZOFRAN ODT) 4 MG disintegrating tablet Take 1 tablet (4 mg total) by mouth every 8 (eight) hours as needed for nausea or vomiting. 05/18/19    Petrucelli, Pleas Koch, PA-C      Allergies    Patient has no known allergies.    Review of Systems   Review of Systems  Physical Exam Updated Vital Signs BP 130/69 (BP Location: Left Arm)   Pulse (!) 54   Temp 98.3 F (36.8 C) (Oral)   Resp 16   Ht 6\' 3"  (1.905 m)   Wt 77.1 kg   SpO2 99%   BMI 21.25 kg/m  Physical Exam Vitals and nursing note reviewed.  HENT:     Mouth/Throat:     Mouth: Mucous membranes are moist.  Pulmonary:     Effort: Pulmonary effort is normal.  Musculoskeletal:        General: Swelling and tenderness present.     Right wrist: Tenderness present. No bony tenderness.       Arms:     Comments: Minimal tenderness over the right thenar eminence but no tenderness in the palm of the hand or fingers  Neurological:     Mental Status: He is alert.     ED Results / Procedures / Treatments   Labs (all labs ordered are listed, but only abnormal results are displayed) Labs Reviewed - No data to display  EKG None  Radiology DG Wrist Complete Right  Result Date: 11/26/2022 CLINICAL DATA:  Patient fell and hurt his wrist. Pain radiating to the elbow. EXAM: RIGHT WRIST - COMPLETE 3+ VIEW  COMPARISON:  None Available. FINDINGS: There is a nondisplaced fracture of the mid scaphoid best seen on oblique view. No additional acute fracture or dislocation. Regional soft tissues are unremarkable. IMPRESSION: Nondisplaced fracture of the mid scaphoid. Electronically Signed   By: Emmaline Kluver M.D.   On: 11/26/2022 10:28    Procedures Procedures    Medications Ordered in ED Medications - No data to display  ED Course/ Medical Decision Making/ A&P                             Medical Decision Making Amount and/or Complexity of Data Reviewed Radiology: ordered and independent interpretation performed. Decision-making details documented in ED Course.   Patient presenting after an injury to the right wrist area after he fell yesterday while playing  basketball.  Patient has swelling and tenderness in the distal radius region as well as the snuffbox.  I have independently visualized and interpreted pt's images today.  Right hand films today with concern for a nondisplaced scaphoid fracture.  Patient placed in a thumb spica splint, sports medicine follow-up.  Patient will be nonweightbearing with the right hand until he is seen in follow-up. I evaluated patient's hand after splint was placed and neurovascularly intact and well applied.  Stable for discharge.        Final Clinical Impression(s) / ED Diagnoses Final diagnoses:  Closed nondisplaced fracture of middle third of scaphoid bone of right wrist, initial encounter    Rx / DC Orders ED Discharge Orders     None         Gwyneth Sprout, MD 11/26/22 1102    Gwyneth Sprout, MD 11/26/22 1139

## 2023-01-30 ENCOUNTER — Emergency Department (HOSPITAL_COMMUNITY): Payer: 59

## 2023-01-30 ENCOUNTER — Encounter (HOSPITAL_COMMUNITY): Payer: Self-pay | Admitting: Radiology

## 2023-01-30 ENCOUNTER — Emergency Department (HOSPITAL_COMMUNITY)
Admission: EM | Admit: 2023-01-30 | Discharge: 2023-01-30 | Disposition: A | Discharge status: Home / Self Care | Payer: 59 | Attending: Emergency Medicine | Admitting: Emergency Medicine

## 2023-01-30 ENCOUNTER — Other Ambulatory Visit: Payer: Self-pay

## 2023-01-30 DIAGNOSIS — Z23 Encounter for immunization: Secondary | ICD-10-CM | POA: Diagnosis not present

## 2023-01-30 DIAGNOSIS — S0181XA Laceration without foreign body of other part of head, initial encounter: Secondary | ICD-10-CM | POA: Diagnosis present

## 2023-01-30 DIAGNOSIS — J45909 Unspecified asthma, uncomplicated: Secondary | ICD-10-CM | POA: Insufficient documentation

## 2023-01-30 DIAGNOSIS — Z7951 Long term (current) use of inhaled steroids: Secondary | ICD-10-CM | POA: Insufficient documentation

## 2023-01-30 MED ORDER — LIDOCAINE-EPINEPHRINE (PF) 2 %-1:200000 IJ SOLN
10.0000 mL | Freq: Once | INTRAMUSCULAR | Status: AC
  Administered 2023-01-30: 10 mL
  Filled 2023-01-30: qty 20

## 2023-01-30 MED ORDER — TETANUS-DIPHTH-ACELL PERTUSSIS 5-2.5-18.5 LF-MCG/0.5 IM SUSY
0.5000 mL | PREFILLED_SYRINGE | Freq: Once | INTRAMUSCULAR | Status: AC
  Administered 2023-01-30: 0.5 mL via INTRAMUSCULAR
  Filled 2023-01-30: qty 0.5

## 2023-01-30 NOTE — ED Notes (Signed)
Pt with bandage to forehead and left knee. Discharge instructions reviewed with patient. Pt understands that the stitches need to come out in 5 days.

## 2023-01-30 NOTE — Discharge Instructions (Signed)
It was a pleasure taking part in your care today.  As we discussed, you will need to return to the ED, PCP or urgent care in 5 days for removal of sutures.  Please leave bandage applied today on the wound for 24 hours.  You may then take it off.  Please read the attached guide concerning laceration care and adults for further management.  Please return to the ED with any new or worsening signs or symptoms.
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
## 2023-02-04 ENCOUNTER — Emergency Department (HOSPITAL_COMMUNITY)
Admission: EM | Admit: 2023-02-04 | Discharge: 2023-02-04 | Disposition: A | Discharge status: Home / Self Care | Payer: 59 | Attending: Emergency Medicine | Admitting: Emergency Medicine

## 2023-02-04 ENCOUNTER — Encounter (HOSPITAL_COMMUNITY): Payer: Self-pay | Admitting: Emergency Medicine

## 2023-02-04 ENCOUNTER — Other Ambulatory Visit: Payer: Self-pay

## 2023-02-04 DIAGNOSIS — Z4802 Encounter for removal of sutures: Secondary | ICD-10-CM | POA: Diagnosis present

## 2023-02-04 NOTE — Discharge Instructions (Addendum)
Return to ED in 2 days for suture removal.

## 2023-02-04 NOTE — ED Provider Notes (Signed)
[place] EMERGENCY DEPARTMENT AT [facility] Provider Note   CSN: [id number] Arrival date & time: 02/04/23  1643     History  Chief Complaint  Patient presents with   Suture / Staple Removal    Terrence [name] is a 22 y.o. male with history of asthma, heart murmur, migraines.  Patient presents to ED for evaluation/suture removal.  Patient states that 5 days ago he had sutures placed.  Patient denies any concerns or complaints.  States he is just here to have sutures removed.   Suture / Staple Removal       Home Medications Prior to Admission medications   Medication Sig Start Date End Date Taking? Authorizing Provider  acetaminophen (TYLENOL) 325 MG tablet Take 650 mg by mouth every 4 (four) hours as needed for mild pain.     [provider]  albuterol (PROVENTIL HFA;VENTOLIN HFA) 108 (90 BASE) MCG/ACT inhaler Inhale 2 puffs into the lungs every 6 (six) hours as needed for wheezing.    [provider]  cetirizine (ZYRTEC) 1 MG/ML syrup Take 10 mg by mouth daily as needed (allergies).  11/01/12   [provider]  divalproex (DEPAKOTE SPRINKLE) 125 MG capsule Take 4 capsules by mouth in the evening 04/30/16   Elveria Rising, NP  fluticasone (FLONASE) 50 MCG/ACT nasal spray Place 1-2 sprays into both nostrils daily as needed for allergies or rhinitis.  11/01/12   [provider]  ibuprofen (CHILDRENS IBUPROFEN) 100 MG/5ML suspension Take 400 mg by mouth every 8 (eight) hours as needed for mild pain.  07/06/13   [provider]  lidocaine (XYLOCAINE) 2 % solution Use as directed 15 mLs in the mouth or throat as needed for mouth pain. 04/20/20   Garlon Hatchet, PA-C  ondansetron (ZOFRAN ODT) 4 MG disintegrating tablet Take 1 tablet (4 mg total) by mouth every 8 (eight) hours as needed for nausea or vomiting. 05/18/19   Petrucelli, Pleas Koch, PA-C      Allergies    Patient has no known allergies.    Review of Systems   Review  of Systems  All other systems reviewed and are negative.   Physical Exam Updated Vital Signs BP 129/62   Pulse 60   Temp 98.7 F (37.1 C) (Oral)   Resp 18   SpO2 100%  Physical Exam Vitals and nursing note reviewed.  Constitutional:      General: He is not in acute distress.    Appearance: He is well-developed.  HENT:     Head: Normocephalic and atraumatic.  Eyes:     Conjunctiva/sclera: Conjunctivae normal.  Cardiovascular:     Rate and Rhythm: Normal rate and regular rhythm.     Heart sounds: No murmur heard. Pulmonary:     Effort: Pulmonary effort is normal. No respiratory distress.     Breath sounds: Normal breath sounds.  Abdominal:     Palpations: Abdomen is soft.     Tenderness: There is no abdominal tenderness.  Musculoskeletal:        General: No swelling.     Cervical back: Neck supple.  Skin:    General: Skin is warm and dry.     Capillary Refill: Capillary refill takes less than 2 seconds.     Comments: 3 sutures in place between eyebrows.  No drainage, no purulence, no surrounding erythema.  Neurological:     Mental Status: He is alert.  Psychiatric:        Mood and Affect: Mood  normal.     ED Results / Procedures / Treatments   Labs (all labs ordered are listed, but only abnormal results are displayed) Labs Reviewed - No data to display  EKG None  Radiology No results found.  Procedures Procedures   Medications Ordered in ED Medications - No data to display  ED Course/ Medical Decision Making/ A&P    Medical Decision Making  22 year old presents ED for evaluation.  Please see HPI for further details.  On exam patient does have a well-appearing and well-healing laceration between his eyebrows.  Does appear to be healing well however not fully healed at this time.  Had attending Dr. Joselyn Glassman, and take a look.  We both agree that the patient most likely is in need of more time, 2 days.  Patient advised to return to ED in 2 days for suture  removal.  He voiced understanding.  He is agreeable to plan.   Final Clinical Impression(s) / ED Diagnoses Final diagnoses:  Encounter for removal of sutures    Rx / DC Orders ED Discharge Orders     None         Al Decant, PA-C 02/04/23 1726    Tegeler, Canary Brim, MD 02/04/23 2234

## 2023-02-04 NOTE — ED Triage Notes (Signed)
Patient presents to have his sutures removed.

## 2023-02-08 ENCOUNTER — Emergency Department (HOSPITAL_COMMUNITY)
Admission: EM | Admit: 2023-02-08 | Discharge: 2023-02-08 | Disposition: A | Discharge status: Home / Self Care | Payer: 59 | Attending: Emergency Medicine | Admitting: Emergency Medicine

## 2023-02-08 ENCOUNTER — Other Ambulatory Visit: Payer: Self-pay

## 2023-02-08 ENCOUNTER — Encounter (HOSPITAL_COMMUNITY): Payer: Self-pay | Admitting: Emergency Medicine

## 2023-02-08 DIAGNOSIS — J45909 Unspecified asthma, uncomplicated: Secondary | ICD-10-CM | POA: Insufficient documentation

## 2023-02-08 DIAGNOSIS — Z4802 Encounter for removal of sutures: Secondary | ICD-10-CM | POA: Diagnosis present

## 2023-02-08 NOTE — ED Triage Notes (Signed)
Pt present to have sutures removed from his forehead.

## 2023-02-08 NOTE — Discharge Instructions (Signed)
It was pleasure caring for you today.  Wound appears appropriately healed.  Seek emergency care if experiencing any new or worsening symptoms.

## 2023-02-08 NOTE — ED Provider Notes (Signed)
[place] EMERGENCY DEPARTMENT AT [facility] Provider Note   CSN: [id number] Arrival date & time: 02/08/23  1628     History  Chief Complaint  Patient presents with   Suture / Staple Removal    Terrence [name] is a 22 y.o. male with PMHx asthma and migraines who presents to ED for suture removal. Patient had sutures placed 9 days ago. Patient attempted to have sutures removed 4 days ago but wound was not healed enough for removal at that time. Patient declining any complaints today.    Suture / Staple Removal       Home Medications Prior to Admission medications   Medication Sig Start Date End Date Taking? Authorizing Provider  acetaminophen (TYLENOL) 325 MG tablet Take 650 mg by mouth every 4 (four) hours as needed for mild pain.     [provider]  albuterol (PROVENTIL HFA;VENTOLIN HFA) 108 (90 BASE) MCG/ACT inhaler Inhale 2 puffs into the lungs every 6 (six) hours as needed for wheezing.    [provider]  cetirizine (ZYRTEC) 1 MG/ML syrup Take 10 mg by mouth daily as needed (allergies).  11/01/12   [provider]  divalproex (DEPAKOTE SPRINKLE) 125 MG capsule Take 4 capsules by mouth in the evening 04/30/16   Elveria Rising, NP  fluticasone (FLONASE) 50 MCG/ACT nasal spray Place 1-2 sprays into both nostrils daily as needed for allergies or rhinitis.  11/01/12   [provider]  ibuprofen (CHILDRENS IBUPROFEN) 100 MG/5ML suspension Take 400 mg by mouth every 8 (eight) hours as needed for mild pain.  07/06/13   [provider]  lidocaine (XYLOCAINE) 2 % solution Use as directed 15 mLs in the mouth or throat as needed for mouth pain. 04/20/20   Garlon Hatchet, PA-C  ondansetron (ZOFRAN ODT) 4 MG disintegrating tablet Take 1 tablet (4 mg total) by mouth every 8 (eight) hours as needed for nausea or vomiting. 05/18/19   Petrucelli, Pleas Koch, PA-C      Allergies    Patient has no known allergies.    Review of Systems    Review of Systems  Musculoskeletal:        Suture removal     Physical Exam Updated Vital Signs There were no vitals taken for this visit. Physical Exam Vitals and nursing note reviewed.  Constitutional:      General: He is not in acute distress.    Appearance: He is not ill-appearing or toxic-appearing.  HENT:     Head: Normocephalic and atraumatic.  Eyes:     General: No scleral icterus.       Right eye: No discharge.        Left eye: No discharge.     Conjunctiva/sclera: Conjunctivae normal.  Cardiovascular:     Rate and Rhythm: Normal rate.  Pulmonary:     Effort: Pulmonary effort is normal.  Abdominal:     General: Abdomen is flat.  Skin:    General: Skin is warm and dry.     Comments: Laceration appears appropriately healed. 4 sutures intact. Area is clean and dry without any concern for swelling, erythema, bleeding, or purulence.  Neurological:     General: No focal deficit present.     Mental Status: He is alert. Mental status is at baseline.  Psychiatric:        Mood and Affect: Mood normal.        Behavior: Behavior normal.     ED Results / Procedures / Treatments  Labs (all labs ordered are listed, but only abnormal results are displayed) Labs Reviewed - No data to display  EKG None  Radiology No results found.  Procedures .Suture Removal  Date/Time: 02/08/2023 5:11 PM  Performed by: Dorthy Cooler, PA-C Authorized by: Dorthy Cooler, PA-C   Consent:    Consent obtained:  Verbal   Consent given by:  Patient   Risks, benefits, and alternatives were discussed: yes     Alternatives discussed:  No treatment Universal protocol:    Patient identity confirmed:  Verbally with patient Location:    Location:  Head/neck   Head/neck location:  Eyebrow   Eyebrow location:  L eyebrow Procedure details:    Wound appearance:  No signs of infection   Number of sutures removed:  4 Post-procedure details:    Procedure completion:   Tolerated well, no immediate complications     Medications Ordered in ED Medications - No data to display  ED Course/ Medical Decision Making/ A&P                                 Medical Decision Making  This patient presents to the ED for concern of suture removal, this involves an extensive number of treatment options, and is a complaint that carries with it a high risk of complications and morbidity.  The differential diagnosis includes poor wound healing, infection   Co morbidities that complicate the patient evaluation  Migraine asthma   Problem List / ED Course / Critical interventions / Medication management  Patient presents to ED for suture removal.  Declining any other symptoms at this time.  Chart review showing 4 sutures were placed 9 days ago.  The 4 sutures are clean, dry and intact on medial side of left eyebrow.  Laceration appears appropriately healed.  No swelling, erythema, or purulence.  4 sutures removed without complication. I have reviewed the patients home medicines and have made adjustments as needed Provided with return precautions.  Patient overall well-appearing.  Discharged in good condition.   Social Determinants of Health:  none          Final Clinical Impression(s) / ED Diagnoses Final diagnoses:  Visit for suture removal    Rx / DC Orders ED Discharge Orders     None         Margarita Rana 02/08/23 1715    Wynetta Fines, MD 02/09/23 0020

## 2023-02-09 ENCOUNTER — Emergency Department
Admission: EM | Admit: 2023-02-09 | Discharge: 2023-02-09 | Disposition: A | Discharge status: Home / Self Care | Payer: 59 | Attending: Emergency Medicine | Admitting: Emergency Medicine

## 2023-02-09 ENCOUNTER — Other Ambulatory Visit: Payer: Self-pay

## 2023-02-09 DIAGNOSIS — Z202 Contact with and (suspected) exposure to infections with a predominantly sexual mode of transmission: Secondary | ICD-10-CM | POA: Insufficient documentation

## 2023-02-09 LAB — CHLAMYDIA/NGC RT PCR (ARMC ONLY)
Chlamydia Tr: NOT DETECTED
N gonorrhoeae: NOT DETECTED

## 2023-02-09 MED ORDER — CEFTRIAXONE SODIUM 500 MG IJ SOLR
500.0000 mg | Freq: Once | INTRAMUSCULAR | Status: DC
  Filled 2023-02-09: qty 500

## 2023-02-09 MED ORDER — METRONIDAZOLE 500 MG PO TABS
2000.0000 mg | ORAL_TABLET | Freq: Once | ORAL | Status: DC
  Filled 2023-02-09: qty 4

## 2023-02-09 MED ORDER — AZITHROMYCIN 1 G PO PACK: 1.0000 g | PACK | Freq: Once | ORAL | Status: DC

## 2023-02-09 MED ORDER — LIDOCAINE HCL (PF) 1 % IJ SOLN
5.0000 mL | Freq: Once | INTRAMUSCULAR | Status: DC
  Filled 2023-02-09: qty 5

## 2023-02-09 NOTE — Discharge Instructions (Addendum)
You test results are negative and can be viewed online at: MyChart. You should follow-up with your primary provider or local health part of ongoing testing and treatment.

## 2023-02-09 NOTE — ED Triage Notes (Signed)
Pt via POV from home. Pt here for STD check. Denies any symptoms. Pt is A&Ox4 and NAD, ambulatory on arrival.

## 2023-02-09 NOTE — ED Provider Notes (Cosign Needed Addendum)
[facility] Emergency Department Provider Note     Event Date/Time   First MD Initiated Contact with Patient 02/09/23 1327     (approximate)   History   Exposure to STD   HPI  Terrence [name] is a 22 y.o. male with a noncontributory medical history, presents to the ED for STD testing.  Patient reports exposure denies any STD symptoms including dysuria, hematuria, penile discharge or lesions.  Patient will report that the exposure in question occurred better than 4 weeks ago.  He denies any symptoms in the interim.  Physical Exam   Triage Vital Signs: ED Triage Vitals  Encounter Vitals Group     BP 02/09/23 1155 134/70     Systolic BP Percentile --      Diastolic BP Percentile --      Pulse Rate 02/09/23 1155 (!) 56     Resp 02/09/23 1155 20     Temp 02/09/23 1155 98.5 F (36.9 C)     Temp Source 02/09/23 1155 Oral     SpO2 02/09/23 1155 99 %     Weight 02/09/23 1152 170 lb (77.1 kg)     Height 02/09/23 1152 6\' 3"  (1.905 m)     Head Circumference --      Peak Flow --      Pain Score 02/09/23 1152 0     Pain Loc --      Pain Education --      Exclude from Growth Chart --     Most recent vital signs: Vitals:   02/09/23 1155  BP: 134/70  Pulse: (!) 56  Resp: 20  Temp: 98.5 F (36.9 C)  SpO2: 99%    General Awake, no distress. nad HEENT NCAT. PERRL. EOMI. No rhinorrhea. Mucous membranes are moist.  CV:  Good peripheral perfusion.  RESP:  Normal effort.  ABD:  No distention.  GU:  deferred   ED Results / Procedures / Treatments   Labs (all labs ordered are listed, but only abnormal results are displayed) Labs Reviewed  CHLAMYDIA/NGC RT PCR [facility] ONLY)               EKG   RADIOLOGY  No results found.   PROCEDURES:  Critical Care performed: No  Procedures   MEDICATIONS ORDERED IN ED: Medications - No data to display    IMPRESSION / MDM / ASSESSMENT AND PLAN / ED COURSE  I reviewed the triage vital signs and  the nursing notes.                              Differential diagnosis includes, but is not limited to, gonorrhea, chlamydia, NGU, trichomoniasis, UTI  Patient's presentation is most consistent with acute complicated illness / injury requiring diagnostic workup.  Patient's diagnosis is consistent with STD exposure. He denies any symptoms, but is requesting testing and treatment. His test results are negative. Patient is to follow up with [facility] or his PCP for further testing/treatment as needed or otherwise directed. Patient is given ED precautions to return to the ED for any worsening or new symptoms.  FINAL CLINICAL IMPRESSION(S) / ED DIAGNOSES   Final diagnoses:  Exposure to STD     Rx / DC Orders   ED Discharge Orders     None        Note:  This document was prepared using Dragon voice recognition software and may include unintentional dictation errors.  Lissa Hoard, PA-C 02/09/23 [address] Charlesetta Ivory, PA-C 02/09/23 1406    Trinna Post, MD 02/11/23 [phone]

## 2023-03-30 ENCOUNTER — Emergency Department (HOSPITAL_BASED_OUTPATIENT_CLINIC_OR_DEPARTMENT_OTHER)
Admission: EM | Admit: 2023-03-30 | Discharge: 2023-03-30 | Disposition: A | Discharge status: Home / Self Care | Payer: 59 | Attending: Emergency Medicine | Admitting: Emergency Medicine

## 2023-03-30 ENCOUNTER — Encounter (HOSPITAL_BASED_OUTPATIENT_CLINIC_OR_DEPARTMENT_OTHER): Payer: Self-pay | Admitting: Emergency Medicine

## 2023-03-30 ENCOUNTER — Other Ambulatory Visit: Payer: Self-pay

## 2023-03-30 ENCOUNTER — Other Ambulatory Visit (HOSPITAL_BASED_OUTPATIENT_CLINIC_OR_DEPARTMENT_OTHER): Payer: Self-pay

## 2023-03-30 DIAGNOSIS — K529 Noninfective gastroenteritis and colitis, unspecified: Secondary | ICD-10-CM | POA: Insufficient documentation

## 2023-03-30 DIAGNOSIS — J45909 Unspecified asthma, uncomplicated: Secondary | ICD-10-CM | POA: Diagnosis not present

## 2023-03-30 DIAGNOSIS — Z7951 Long term (current) use of inhaled steroids: Secondary | ICD-10-CM | POA: Diagnosis not present

## 2023-03-30 DIAGNOSIS — R1110 Vomiting, unspecified: Secondary | ICD-10-CM | POA: Diagnosis present

## 2023-03-30 MED ORDER — LOPERAMIDE HCL 2 MG PO TABS
2.0000 mg | ORAL_TABLET | Freq: Four times a day (QID) | ORAL | 0 refills | Status: AC | PRN
  Filled 2023-03-30: qty 24, 6d supply, fill #0

## 2023-03-30 MED ORDER — ONDANSETRON 4 MG PO TBDP
4.0000 mg | ORAL_TABLET | Freq: Once | ORAL | Status: AC
  Administered 2023-03-30: 4 mg via ORAL
  Filled 2023-03-30: qty 1

## 2023-03-30 MED ORDER — ONDANSETRON 4 MG PO TBDP
4.0000 mg | ORAL_TABLET | Freq: Three times a day (TID) | ORAL | 0 refills | Status: DC | PRN
  Filled 2023-03-30: qty 6, 2d supply, fill #0

## 2023-03-30 NOTE — Discharge Instructions (Addendum)
Take the Zofran as needed for nausea and vomiting.  Take the Imodium A-D as needed for diarrhea.  Work note provided.  Return if not improving in 2 days or for any new or worse symptoms.  Hydrate well Gatorade would be an excellent choice.  Then advance to a bland diet.

## 2023-03-30 NOTE — ED Triage Notes (Signed)
Pt to ER with c/o n/v/d that started in the middle of the night last night.  Pt states  his son was sick with similar symptoms the end of last week.  Pt drinking water in triage without difficulty.

## 2023-03-30 NOTE — ED Notes (Signed)
D/c paperwork reviewed with pt, including prescriptions.  All questions and/or concerns addressed at time of d/c.  No further needs expressed. . Pt verbalized understanding, Ambulatory without assistance to ED exit, NAD.   

## 2023-03-30 NOTE — ED Provider Notes (Signed)
[place] [facility] Provider Note   CSN: [id number] Arrival date & time: 03/30/23  1049     History  Chief Complaint  Patient presents with   Emesis   Diarrhea    Terrence [name] is a 22 y.o. male.  Patient with acute onset around 10:00 last night of nausea vomiting and diarrhea.  No vomiting since being here this morning.  But has had 1 loose bowel movement.  No blood in the vomit or the bowel movements.  Patient denies any abdominal pain.  Patient was a little concerned it could have been food poisoning.  But he has been exposed to 2 people that have had similar symptoms in the last few days.  No one else that he had dinner with last night got sick.  Past medical history significant for history of asthma history of migraines.  Patient does not use tobacco products.       Home Medications Prior to Admission medications   Medication Sig Start Date End Date Taking? Authorizing Provider  loperamide (IMODIUM A-D) 2 MG tablet Take 1 tablet (2 mg total) by mouth 4 (four) times daily as needed for diarrhea or loose stools. 03/30/23  Yes Vanetta Mulders, MD  ondansetron (ZOFRAN-ODT) 4 MG disintegrating tablet Take 1 tablet (4 mg total) by mouth every 8 (eight) hours as needed. 03/30/23  Yes Vanetta Mulders, MD  acetaminophen (TYLENOL) 325 MG tablet Take 650 mg by mouth every 4 (four) hours as needed for mild pain.     [provider]  albuterol (PROVENTIL HFA;VENTOLIN HFA) 108 (90 BASE) MCG/ACT inhaler Inhale 2 puffs into the lungs every 6 (six) hours as needed for wheezing.    [provider]  cetirizine (ZYRTEC) 1 MG/ML syrup Take 10 mg by mouth daily as needed (allergies).  11/01/12   [provider]  divalproex (DEPAKOTE SPRINKLE) 125 MG capsule Take 4 capsules by mouth in the evening 04/30/16   Elveria Rising, NP  fluticasone (FLONASE) 50 MCG/ACT nasal spray Place 1-2 sprays into both nostrils daily as needed for allergies  or rhinitis.  11/01/12   [provider]  ibuprofen (CHILDRENS IBUPROFEN) 100 MG/5ML suspension Take 400 mg by mouth every 8 (eight) hours as needed for mild pain.  07/06/13   [provider]  lidocaine (XYLOCAINE) 2 % solution Use as directed 15 mLs in the mouth or throat as needed for mouth pain. 04/20/20   Garlon Hatchet, PA-C  ondansetron (ZOFRAN ODT) 4 MG disintegrating tablet Take 1 tablet (4 mg total) by mouth every 8 (eight) hours as needed for nausea or vomiting. 05/18/19   Petrucelli, Pleas Koch, PA-C      Allergies    Patient has no known allergies.    Review of Systems   Review of Systems  Constitutional:  Negative for chills and fever.  HENT:  Negative for ear pain and sore throat.   Eyes:  Negative for pain and visual disturbance.  Respiratory:  Negative for cough and shortness of breath.   Cardiovascular:  Negative for chest pain and palpitations.  Gastrointestinal:  Positive for diarrhea, nausea and vomiting. Negative for abdominal pain.  Genitourinary:  Negative for dysuria and hematuria.  Musculoskeletal:  Negative for arthralgias and back pain.  Skin:  Negative for color change and rash.  Neurological:  Negative for seizures and syncope.  All other systems reviewed and are negative.   Physical Exam Updated Vital Signs BP 136/64   Pulse 77  Temp 98.5 F (36.9 C)   Resp 18   Ht 1.88 m (6\' 2" )   Wt 77.1 kg   SpO2 100%   BMI 21.83 kg/m  Physical Exam Vitals and nursing note reviewed.  Constitutional:      General: He is not in acute distress.    Appearance: Normal appearance. He is well-developed.  HENT:     Head: Normocephalic and atraumatic.     Mouth/Throat:     Mouth: Mucous membranes are moist.  Eyes:     Extraocular Movements: Extraocular movements intact.     Conjunctiva/sclera: Conjunctivae normal.     Pupils: Pupils are equal, round, and reactive to light.  Cardiovascular:     Rate and Rhythm: Normal rate and regular rhythm.      Heart sounds: No murmur heard. Pulmonary:     Effort: Pulmonary effort is normal. No respiratory distress.     Breath sounds: Normal breath sounds.  Abdominal:     General: There is no distension.     Palpations: Abdomen is soft.     Tenderness: There is no abdominal tenderness. There is no guarding.  Musculoskeletal:        General: No swelling.     Cervical back: Neck supple.  Skin:    General: Skin is warm and dry.     Capillary Refill: Capillary refill takes less than 2 seconds.  Neurological:     General: No focal deficit present.     Mental Status: He is alert and oriented to person, place, and time.  Psychiatric:        Mood and Affect: Mood normal.     ED Results / Procedures / Treatments   Labs (all labs ordered are listed, but only abnormal results are displayed) Labs Reviewed - No data to display  EKG None  Radiology No results found.  Procedures Procedures    Medications Ordered in ED Medications  ondansetron (ZOFRAN-ODT) disintegrating tablet 4 mg (has no administration in time range)    ED Course/ Medical Decision Making/ A&P                                 Medical Decision Making Risk OTC drugs. Prescription drug management.   Some is consistent with a viral gastroenteritis.  Patient not significantly dehydrated.  Vomiting has resolved.  Will treat with Zofran and ODT hydration orally and Imodium A-D.  Patient will also be given work note.  Patient will return for any new or worse symptoms or if not improving in 2 days.  Patient nontoxic no acute distress.  Final Clinical Impression(s) / ED Diagnoses Final diagnoses:  Gastroenteritis    Rx / DC Orders ED Discharge Orders          Ordered    ondansetron (ZOFRAN-ODT) 4 MG disintegrating tablet  Every 8 hours PRN        03/30/23 1350    loperamide (IMODIUM A-D) 2 MG tablet  4 times daily PRN        03/30/23 1350              Vanetta Mulders, MD 03/30/23 1354

## 2023-04-13 ENCOUNTER — Other Ambulatory Visit (HOSPITAL_BASED_OUTPATIENT_CLINIC_OR_DEPARTMENT_OTHER): Payer: Self-pay

## 2023-04-15 ENCOUNTER — Ambulatory Visit (INDEPENDENT_AMBULATORY_CARE_PROVIDER_SITE_OTHER): Payer: Self-pay | Admitting: Licensed Clinical Social Worker

## 2023-04-15 ENCOUNTER — Encounter (HOSPITAL_COMMUNITY): Payer: Self-pay

## 2023-04-15 DIAGNOSIS — F331 Major depressive disorder, recurrent, moderate: Secondary | ICD-10-CM

## 2023-04-15 DIAGNOSIS — F6381 Intermittent explosive disorder: Secondary | ICD-10-CM | POA: Insufficient documentation
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
## 2023-08-22 ENCOUNTER — Ambulatory Visit (HOSPITAL_COMMUNITY): Admission: EM | Admit: 2023-08-22 | Discharge: 2023-08-22 | Disposition: A | Discharge status: Home / Self Care

## 2023-08-22 DIAGNOSIS — R454 Irritability and anger: Secondary | ICD-10-CM
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
## 2023-08-22 NOTE — Discharge Instructions (Addendum)

## 2023-08-22 NOTE — ED Notes (Signed)
Patient Is discharging at this time. Printed AVS reviewed with patient by provider along with follow up appointments and resources. Patient denies SI, HI, and A/V/H. Valuables/belongings returned to patient. No s/s of current distress.  

## 2023-08-23 ENCOUNTER — Ambulatory Visit (INDEPENDENT_AMBULATORY_CARE_PROVIDER_SITE_OTHER): Admitting: Psychiatry

## 2023-08-23 ENCOUNTER — Encounter (HOSPITAL_COMMUNITY): Payer: Self-pay | Admitting: Psychiatry

## 2023-08-23 VITALS — BP 131/75 | HR 64 | Temp 98.9°F | Resp 16

## 2023-08-23 DIAGNOSIS — F6381 Intermittent explosive disorder: Secondary | ICD-10-CM | POA: Diagnosis not present

## 2023-08-23 DIAGNOSIS — F411 Generalized anxiety disorder: Secondary | ICD-10-CM

## 2023-08-23 MED ORDER — ARIPIPRAZOLE 5 MG PO TABS
5.0000 mg | ORAL_TABLET | Freq: Every day | ORAL | 3 refills | Status: DC
Start: 2023-08-23 — End: 2023-09-14

## 2023-08-23 MED ORDER — HYDROXYZINE HCL 10 MG PO TABS
10.0000 mg | ORAL_TABLET | Freq: Three times a day (TID) | ORAL | 3 refills | Status: DC | PRN
Start: 2023-08-23 — End: 2023-09-14
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
## 2023-09-14 ENCOUNTER — Other Ambulatory Visit (HOSPITAL_COMMUNITY): Payer: Self-pay | Admitting: Psychiatry

## 2023-09-14 DIAGNOSIS — F6381 Intermittent explosive disorder: Secondary | ICD-10-CM

## 2023-09-14 DIAGNOSIS — F411 Generalized anxiety disorder: Secondary | ICD-10-CM

## 2023-10-21 ENCOUNTER — Telehealth (HOSPITAL_COMMUNITY): Payer: Self-pay

## 2023-10-21 NOTE — Telephone Encounter (Signed)
 Medication management - Prior authorization for patient's prescribed Abilify  5 mg submitted online with CoverMyMeds and sent to [employer] for review and pending decision.

## 2023-10-22 NOTE — Telephone Encounter (Signed)
 Thank you for this update

## 2023-11-02 ENCOUNTER — Ambulatory Visit (INDEPENDENT_AMBULATORY_CARE_PROVIDER_SITE_OTHER): Admitting: Mental Health

## 2023-11-02 DIAGNOSIS — F6381 Intermittent explosive disorder: Secondary | ICD-10-CM | POA: Diagnosis not present

## 2023-11-02 DIAGNOSIS — F331 Major depressive disorder, recurrent, moderate: Secondary | ICD-10-CM
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
## 2023-11-11 ENCOUNTER — Encounter (HOSPITAL_COMMUNITY): Payer: Self-pay | Admitting: Psychiatry

## 2023-11-17 ENCOUNTER — Other Ambulatory Visit (HOSPITAL_COMMUNITY): Payer: Self-pay | Admitting: Psychiatry

## 2023-11-17 DIAGNOSIS — F6381 Intermittent explosive disorder: Secondary | ICD-10-CM

## 2023-11-18 ENCOUNTER — Telehealth (HOSPITAL_COMMUNITY): Payer: Self-pay | Admitting: *Deleted

## 2023-11-18 NOTE — Telephone Encounter (Signed)
 Submitted on covermymeds PA for his aripiprazole . Waiting on the response. Per covermymeds not submission was make before today though our record says one was submitted a week ago.

## 2023-12-14 ENCOUNTER — Telehealth (HOSPITAL_COMMUNITY): Payer: Self-pay

## 2023-12-14 ENCOUNTER — Telehealth (HOSPITAL_COMMUNITY): Payer: Self-pay | Admitting: *Deleted

## 2023-12-14 ENCOUNTER — Telehealth (HOSPITAL_COMMUNITY): Payer: Self-pay | Admitting: Family

## 2023-12-14 ENCOUNTER — Other Ambulatory Visit (HOSPITAL_COMMUNITY): Payer: Self-pay | Admitting: Family

## 2023-12-14 DIAGNOSIS — F6381 Intermittent explosive disorder: Secondary | ICD-10-CM

## 2023-12-14 MED ORDER — ARIPIPRAZOLE 5 MG PO TABS: 5.0000 mg | ORAL_TABLET | Freq: Every day | ORAL | 0 refills | Status: AC

## 2023-12-14 NOTE — Telephone Encounter (Signed)
 Submitted thru covermymeds a second PA for his aripiprazole . No response from initial submission two weeks ago. Waiting on a response.

## 2023-12-14 NOTE — Progress Notes (Signed)
 Abilify  5mg   was sent to pharmacy

## 2023-12-14 NOTE — Telephone Encounter (Signed)
 Error encounter.   JNL

## 2023-12-14 NOTE — Telephone Encounter (Signed)
 Hello,    Unsure who to properly route this too as Dr. Harl is out on maternity leave and Sharlot is out on vacation. Is there ay way that this Pt can get a refill on his oral Abilify  tablets to be sent to on file pharmacy ?    JNL

## 2023-12-15 NOTE — Telephone Encounter (Signed)
 Message acknowledged and reviewed.

## 2023-12-30 ENCOUNTER — Encounter (HOSPITAL_COMMUNITY): Payer: Self-pay

## 2023-12-30 ENCOUNTER — Ambulatory Visit (HOSPITAL_COMMUNITY): Admitting: Mental Health

## 2023-12-30 DIAGNOSIS — F331 Major depressive disorder, recurrent, moderate: Secondary | ICD-10-CM

## 2023-12-30 DIAGNOSIS — F411 Generalized anxiety disorder: Secondary | ICD-10-CM

## 2023-12-30 NOTE — Progress Notes (Signed)
   THERAPIST PROGRESS NOTE  Session Time: 3:05 pm ( 48 minutes)  Participation Level: Active  Behavioral Response: Casual and NeatAlertEuthymic  Type of Therapy: Individual Therapy  Treatment Goals addressed: STG: Anxiety about court. Terrence [name] maintain stability in community AEB use of anger coping skills as needed with engagement in balanced thinking within the next 90 days.   ProgressTowards Goals: Initial  Interventions: CBT and Supportive  Summary: Terrence [name] is a 23 y.o. male who presents with dx of major depression moderate and anxiety. Presents to session alert and oriented; mood and affect stable. Speech clear and cohernet at normal rate and tone. Engaged and receptive to interventions. Shares with therapist for anger to have been stable and denies irritable outburst. Shares hx of anger outburst and ways that it has effected his life and interactions with the law. Shares to have experiences his father and ways in which he expressed his anger and realized he became like father. Shares has been working to be more calm and does not allow others to anger him. Shares chief complaint of anxiety about upcoming court case and possibility of having to do time in jail. Notes interactions with mother of his children and frustration of not being able to see his children and reports hx of feeling triggered into anger with them. Shares would like to focus on controlling himself, taking accountability and engaging in community in positive manner. 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 Denies safety concerns. Agrees to treatment plan.   Suicidal/Homicidal: Nowithout intent/plan  Therapist Response: Therapist engaged Terrence [name] in therapy session. Completed hceck in and assessed for current level of functioning, sxs management and level of stressors. Explored hx of anger outburst and explore factors that lead to explosive anger. Explored hx of legal involvement. Discussed ability to current  navigate anger and working to regulate emotions. Provided support and encouragement; validated feelings. Explored working to co-parent children and establishing cordial relationships with the mothers. Encouraged ongoing work to focus on controlling anger and being a support to others. Explored treatment plan and what progress would look like. Reviewed session and provided follow up.   Plan: Return again in  x 6 weeks.  Diagnosis: Anxiety state  Major depressive disorder, recurrent episode, moderate (HCC)  Collaboration of Care: Other None  Patient/Guardian was advised Release of Information must be obtained prior to any record release in order to collaborate their care with an outside provider. Patient/Guardian was advised if they have not already done so to contact the registration department to sign all necessary forms in order for us  to release information regarding their care.   Consent: Patient/Guardian gives verbal consent for treatment and assignment of benefits for services provided during this visit. Patient/Guardian expressed understanding and agreed to proceed.   Ty Asal [place], [facility] 12/30/2023

## 2024-02-29 ENCOUNTER — Ambulatory Visit (INDEPENDENT_AMBULATORY_CARE_PROVIDER_SITE_OTHER): Admitting: Mental Health

## 2024-02-29 DIAGNOSIS — F6381 Intermittent explosive disorder: Secondary | ICD-10-CM

## 2024-02-29 DIAGNOSIS — F411 Generalized anxiety disorder: Secondary | ICD-10-CM

## 2024-02-29 DIAGNOSIS — F331 Major depressive disorder, recurrent, moderate: Secondary | ICD-10-CM

## 2024-02-29 NOTE — Progress Notes (Unsigned)
   THERAPIST PROGRESS NOTE  Session Time: 9:03 am ( 55 minutes)  Participation Level: Active  Behavioral Response: CasualAlertWNL  Type of Therapy: Individual Therapy  Treatment Goals addressed: STG: Anxiety about court. [name] maintain stability in community AEB use of anger coping skills as needed with engagement in balanced thinking within the next 90 days.   ProgressTowards Goals: Progressing  Interventions: CBT and Supportive  Summary: Terrence [name] is a 23 y.o. male who presents with dx of major depression moderate and anxiety. Presents to session alert and oriented; mood and affect stable. Speech clear and cohernet at normal rate and tone. Engaged and receptive to interventions. Shares with therapist for anger to have been stable and denies irritable outburst. Shares hx of anger    Suicidal/Homicidal: Nowithout intent/plan  Therapist Response: Therapist engaged Terrence [name] in therapy session. Completed hceck in and assessed for current level of functioning, sxs management and level of stressors. Explored hx of anger outburst and explore factors that lead to explosive anger. Explored hx of legal involvement. Discussed ability to current navigate anger and working to   Plan: Return again in  x 4 weeks.  Diagnosis: Anxiety state  Collaboration of Care: Other None  Patient/Guardian was advised Release of Information must be obtained prior to any record release in order to collaborate their care with an outside provider. Patient/Guardian was advised if they have not already done so to contact the registration department to sign all necessary forms in order for us  to release information regarding their care.   Consent: Patient/Guardian gives verbal consent for treatment and assignment of benefits for services provided during this visit. Patient/Guardian expressed understanding and agreed to proceed.   Ty Asal [place], [facility] 02/29/2024

## 2024-03-20 ENCOUNTER — Ambulatory Visit (HOSPITAL_COMMUNITY)
Admission: EM | Admit: 2024-03-20 | Discharge: 2024-03-20 | Disposition: A | Discharge status: Home / Self Care | Attending: Family Medicine | Admitting: Family Medicine

## 2024-03-20 ENCOUNTER — Other Ambulatory Visit: Payer: Self-pay

## 2024-03-20 ENCOUNTER — Encounter (HOSPITAL_COMMUNITY): Payer: Self-pay | Admitting: *Deleted

## 2024-03-20 DIAGNOSIS — J020 Streptococcal pharyngitis: Secondary | ICD-10-CM | POA: Diagnosis not present

## 2024-03-20 DIAGNOSIS — J069 Acute upper respiratory infection, unspecified: Secondary | ICD-10-CM

## 2024-03-20 LAB — POC COVID19/FLU A&B COMBO
Covid Antigen, POC: NEGATIVE
Influenza A Antigen, POC: NEGATIVE
Influenza B Antigen, POC: NEGATIVE

## 2024-03-20 LAB — POCT RAPID STREP A (OFFICE): Rapid Strep A Screen: POSITIVE — AB

## 2024-03-20 MED ORDER — AMOXICILLIN 875 MG PO TABS: 875.0000 mg | ORAL_TABLET | Freq: Two times a day (BID) | ORAL | 0 refills | Status: AC

## 2024-03-20 MED ORDER — KETOROLAC TROMETHAMINE 30 MG/ML IJ SOLN
30.0000 mg | Freq: Once | INTRAMUSCULAR | Status: AC
  Administered 2024-03-20: 30 mg via INTRAMUSCULAR

## 2024-03-20 MED ORDER — KETOROLAC TROMETHAMINE 30 MG/ML IJ SOLN
INTRAMUSCULAR | Status: AC
  Filled 2024-03-20: qty 1

## 2024-03-20 MED ORDER — IBUPROFEN 800 MG PO TABS: 800.0000 mg | ORAL_TABLET | Freq: Three times a day (TID) | ORAL | 0 refills | Status: AC | PRN

## 2024-03-20 NOTE — Discharge Instructions (Signed)
 Strep test is positive 2 days into taking the antibiotics, throw away the toothbrush and begin using a new one. Patient is not contagious after 24 hours of antibiotics; a full 10 days should be completed to prevent rheumatic fever  COVID and flu tests were negative  Take amoxicillin 875 mg--1 tab twice daily for 7 days  Take ibuprofen 800 mg--1 tab every 8 hours as needed for pain.  You have been given a shot of Toradol  30 mg today.

## 2024-03-20 NOTE — ED Provider Notes (Signed)
 [facility]    CSN: [id number] Arrival date & time: 03/20/24  1207      History   Chief Complaint Chief Complaint  Patient presents with   Sore Throat   Otalgia    HPI Terrence [name] is a 23 y.o. male.    Sore Throat  Otalgia Here for sore throat and now ear pain.  Symptoms have been going on for about 2 days.  No fever or chills no cough or congestion.  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 Authorizing Provider  amoxicillin (AMOXIL) 875 MG tablet Take 1 tablet (875 mg total) by mouth 2 (two) times daily for 10 days. 03/20/24 03/30/24 Yes BanisterSharlet POUR, MD  ARIPiprazole  (ABILIFY ) 5 MG tablet Take 1 tablet (5 mg total) by mouth daily. 12/14/23  Yes Ezzard Staci SAILOR, NP  divalproex  (DEPAKOTE  SPRINKLE) 125 MG capsule Take 4 capsules by mouth in the evening 04/30/16  Yes Goodpasture, Ellouise, NP  hydrOXYzine  (ATARAX ) 10 MG tablet TAKE 1 TABLET BY MOUTH THREE TIMES A DAY AS NEEDED 09/14/23  Yes Parsons, Brittney E, NP  ibuprofen (ADVIL) 800 MG tablet Take 1 tablet (800 mg total) by mouth every 8 (eight) hours as needed (pain). 03/20/24  Yes Vonna Sharlet POUR, MD  acetaminophen  (TYLENOL ) 325 MG tablet Take 650 mg by mouth every 4 (four) hours as needed for mild pain.     [provider]  albuterol  (PROVENTIL  HFA;VENTOLIN  HFA) 108 (90 BASE) MCG/ACT inhaler Inhale 2 puffs into the lungs every 6 (six) hours as needed for wheezing.    [provider]  cetirizine (ZYRTEC) 1 MG/ML syrup Take 10 mg by mouth daily as needed (allergies).  11/01/12   [provider]  fluticasone (FLONASE) 50 MCG/ACT nasal spray Place 1-2 sprays into both nostrils daily as needed for allergies or rhinitis.  11/01/12   [provider]  lidocaine  (XYLOCAINE ) 2 % solution Use as directed 15 mLs in the mouth or throat as needed for mouth pain. 04/20/20   Jarold Olam HERO, PA-C  loperamide  (IMODIUM  A-D) 2 MG tablet Take 1 tablet (2 mg total) by mouth 4 (four) times daily as needed for diarrhea or loose stools. 03/30/23   Zackowski, Scott, MD    Family History Family History  Problem Relation Age of Onset   Migraines Father    Autism Cousin        3 Paternal Cousins have Autism    Social History Social History   Tobacco Use   Smoking status: Never   Smokeless tobacco: Never  Vaping Use   Vaping status: Never Used  Substance Use Topics   Alcohol use: No   Drug use: No  Allergies   Patient has no known allergies.   Review of Systems Review of Systems  HENT:  Positive for ear pain.      Physical Exam Triage Vital Signs ED Triage Vitals  Encounter Vitals Group     BP 03/20/24 1304 121/77     Girls Systolic BP Percentile --      Girls Diastolic BP Percentile --      Boys Systolic BP Percentile --      Boys Diastolic BP Percentile --      Pulse Rate 03/20/24 1304 (!) 108     Resp 03/20/24 1304 18     Temp 03/20/24 1304 98 F (36.7 C)     Temp src --      SpO2 03/20/24 1304 97 %     Weight --      Height --      Head Circumference --      Peak Flow --      Pain Score 03/20/24 1301 10     Pain Loc --      Pain Education --      Exclude from Growth Chart --    No data found.  Updated Vital Signs BP 121/77   Pulse (!) 108   Temp 98  F (36.7 C)   Resp 18   SpO2 97%   Visual Acuity Right Eye Distance:   Left Eye Distance:   Bilateral Distance:    Right Eye Near:   Left Eye Near:    Bilateral Near:     Physical Exam Vitals reviewed.  Constitutional:      General: He is not in acute distress.    Appearance: He is not toxic-appearing.  HENT:     Right Ear: Tympanic membrane and ear canal normal.     Left Ear: Tympanic membrane and ear canal normal.     Nose: Nose normal.     Mouth/Throat:     Mouth: Mucous membranes are moist.     Comments: Tonsils are hypertrophied 2+ with erythema and some clear mucus in the tonsillar crypts.  Eyes:     Extraocular Movements: Extraocular movements intact.     Conjunctiva/sclera: Conjunctivae normal.     Pupils: Pupils are equal, round, and reactive to light.  Cardiovascular:     Rate and Rhythm: Normal rate and regular rhythm.     Heart sounds: No murmur heard. Pulmonary:     Effort: Pulmonary effort is normal. No respiratory distress.     Breath sounds: Normal breath sounds. No stridor. No wheezing, rhonchi or rales.  Musculoskeletal:     Cervical back: Neck supple.  Lymphadenopathy:     Cervical: No cervical adenopathy.  Skin:    Capillary Refill: Capillary refill takes less than 2 seconds.     Coloration: Skin is not jaundiced or pale.  Neurological:     General: No focal deficit present.     Mental Status: He is alert and oriented to person, place, and time.  Psychiatric:        Behavior: Behavior normal.      UC Treatments / Results  Labs (all labs ordered are listed, but only abnormal results are displayed) Labs Reviewed  POCT RAPID STREP A (OFFICE) - Abnormal; Notable for the following components:      Result Value   Rapid Strep A Screen Positive (*)    All other components within normal limits  POC COVID19/FLU A&B COMBO    EKG   Radiology No  results found.  Procedures Procedures (including critical care time)  Medications Ordered in  UC Medications  ketorolac  (TORADOL ) 30 MG/ML injection 30 mg (has no administration in time range)    Initial Impression / Assessment and Plan / UC Course  I have reviewed the triage vital signs and the nursing notes.  Pertinent labs & imaging results that were available during my care of the patient were reviewed by me and considered in my medical decision making (see chart for details).     Toradol  injection is given for pain and ibuprofen is sent to the pharmacy along with amoxicillin for 10 days for the positive strep test.   Final Clinical Impressions(s) / UC Diagnoses   Final diagnoses:  Strep pharyngitis  Viral upper respiratory tract infection     Discharge Instructions      Strep test is positive 2 days into taking the antibiotics, throw away the toothbrush and begin using a new one. Patient is not contagious after 24 hours of antibiotics; a full 10 days should be completed to prevent rheumatic fever  COVID and flu tests were negative  Take amoxicillin 875 mg--1 tab twice daily for 7 days  Take ibuprofen 800 mg--1 tab every 8 hours as needed for pain.  You have been given a shot of Toradol  30 mg today.      ED Prescriptions     Medication Sig Dispense Auth. Provider   amoxicillin (AMOXIL) 875 MG tablet Take 1 tablet (875 mg total) by mouth 2 (two) times daily for 10 days. 20 tablet Westly Hinnant K, MD   ibuprofen (ADVIL) 800 MG tablet Take 1 tablet (800 mg total) by mouth every 8 (eight) hours as needed (pain). 21 tablet Teran Daughenbaugh K, MD      PDMP not reviewed this encounter.   Vonna Sharlet POUR, MD 03/20/24 1332

## 2024-03-20 NOTE — ED Triage Notes (Signed)
 PT reports he has had a sore throat for 2 days and whenever he swallows both ears hurt limited hurts worse than rt.

## 2024-03-21 ENCOUNTER — Emergency Department (HOSPITAL_COMMUNITY)
Admission: EM | Admit: 2024-03-21 | Discharge: 2024-03-21 | Disposition: A | Discharge status: Home / Self Care | Payer: Self-pay | Attending: Emergency Medicine | Admitting: Emergency Medicine

## 2024-03-21 ENCOUNTER — Other Ambulatory Visit: Payer: Self-pay

## 2024-03-21 DIAGNOSIS — J029 Acute pharyngitis, unspecified: Secondary | ICD-10-CM | POA: Diagnosis present

## 2024-03-21 DIAGNOSIS — J45909 Unspecified asthma, uncomplicated: Secondary | ICD-10-CM | POA: Insufficient documentation

## 2024-03-21 DIAGNOSIS — H9203 Otalgia, bilateral: Secondary | ICD-10-CM | POA: Diagnosis not present

## 2024-03-21 DIAGNOSIS — J020 Streptococcal pharyngitis: Secondary | ICD-10-CM | POA: Diagnosis not present

## 2024-03-21 MED ORDER — DEXAMETHASONE 10 MG/ML FOR PEDIATRIC ORAL USE
10.0000 mg | Freq: Once | INTRAMUSCULAR | Status: AC
  Administered 2024-03-21: 10 mg via ORAL
  Filled 2024-03-21: qty 1

## 2024-03-21 MED ORDER — DEXAMETHASONE 1 MG/ML PO CONC: 10.0000 mg | Freq: Once | ORAL | Status: DC

## 2024-03-21 MED ORDER — DEXAMETHASONE 1 MG/ML PO CONC
10.0000 mg | Freq: Once | ORAL | Status: DC
  Filled 2024-03-21: qty 10

## 2024-03-21 NOTE — ED Triage Notes (Signed)
 Pt. Stated, Ive had sore throat and ear pain. Yes[facility] to [facility] AND TESTED POSITIVE FOR STREP AND GIVEN ANTIBIOTIC. Yesterday/last night my left ear started hurting and could not sleep.  Explained how the antibiotic works , not over night.

## 2024-03-21 NOTE — Discharge Instructions (Signed)
 Please use Tylenol  or ibuprofen for pain.  You may use 600 mg ibuprofen every 6 hours or 1000 mg of Tylenol  every 6 hours.  You may choose to alternate between the 2.  This would be most effective.  Not to exceed 4 g of Tylenol  within 24 hours.  Not to exceed 3200 mg ibuprofen 24 hours.  Please continue taking the entire course of antibiotics that you are already prescribed.  The oral steroid that we gave you in the emergency department should help to decrease the inflammation of the tonsils so that you start to feel better a little bit sooner.  However please note that it will take several days before you have resolution of your sore throat and ear pain.  Please return if you have significant worsening pain despite 2 to 3 days of antibiotic treatment, feel that you are unable to swallow or breathe.

## 2024-03-21 NOTE — ED Notes (Signed)
 Patient dc by RN [place]. Patient verbalizes understanding of instructions without additional questions. Ambulatory to lobby

## 2024-03-21 NOTE — ED Provider Notes (Signed)
 [facility] Provider Note   CSN: [id number] Arrival date & time: 03/21/24  9241     Patient presents with: Sore Throat and Otalgia   Terrence [name] is a 23 y.o. male with past medical history significant for asthma, depression who presents concern for sore throat, ear pain.  He was seen urgent care yesterday and diagnosed with strep throat.  He reports he has taken 1 dose of antibiotics but has not had significant improvement since then.  Has been taking ibuprofen, Tylenol .  Denies any drainage from ears.  Denies any fever.    Sore Throat  Otalgia      Prior to Admission medications   Medication Sig Start Date End Date Taking? Authorizing Provider  acetaminophen  (TYLENOL ) 325 MG tablet Take 650 mg by mouth every 4 (four) hours as needed for mild pain.     [provider]  albuterol  (PROVENTIL  HFA;VENTOLIN  HFA) 108 (90 BASE) MCG/ACT inhaler Inhale 2 puffs into the lungs every 6 (six) hours as needed for wheezing.    [provider]  amoxicillin (AMOXIL) 875 MG tablet Take 1 tablet (875 mg total) by mouth 2 (two) times daily for 10 days. 03/20/24 03/30/24  Vonna Sharlet POUR, MD  ARIPiprazole  (ABILIFY ) 5 MG tablet Take 1 tablet (5 mg total) by mouth daily. 12/14/23   Ezzard Staci SAILOR, NP  cetirizine (ZYRTEC) 1 MG/ML syrup Take 10 mg by mouth daily as needed (allergies).  11/01/12   [provider]  divalproex  (DEPAKOTE  SPRINKLE) 125 MG capsule Take 4 capsules by mouth in the evening 04/30/16   Marianna [place], NP  fluticasone (FLONASE) 50 MCG/ACT nasal spray Place 1-2 sprays into both nostrils daily as needed for allergies or rhinitis.  11/01/12   [provider]  hydrOXYzine  (ATARAX ) 10 MG tablet TAKE 1 TABLET BY MOUTH THREE TIMES A DAY AS NEEDED 09/14/23   Parsons, Brittney E, NP  ibuprofen (ADVIL) 800 MG tablet Take 1 tablet (800 mg total) by mouth every 8 (eight) hours as needed (pain). 03/20/24   Vonna Sharlet POUR, MD  lidocaine  (XYLOCAINE ) 2 % solution Use as directed 15 mLs in the mouth or throat as needed for mouth pain. 04/20/20   Jarold Olam HERO, PA-C  loperamide  (IMODIUM  A-D) 2 MG tablet Take 1 tablet (2 mg total) by mouth 4 (four) times daily as needed for diarrhea or loose stools. 03/30/23   Zackowski, Scott, MD    Allergies: Patient has no known allergies.    Review of Systems  HENT:  Positive for ear pain.   All other systems reviewed and are negative.   Updated Vital Signs BP (!) 151/74   Pulse 95   Temp 98.7 F (37.1 C)   Resp 16   Ht 6' 2 (1.88 m)   Wt 80.7 kg   SpO2 100%   BMI 22.85 kg/m   Physical Exam Vitals and nursing note reviewed.  Constitutional:      General: He is not in acute distress.    Appearance: Normal appearance.  HENT:     Head: Normocephalic and atraumatic.     Comments: Patient with a fair amount of cerumen impaction but visualized TM without significant redness, effusion.   Posterior oropharynx with significant tonsillar swelling, exudate.  Uvula midline, no floor of mouth swelling, redness, no anterior cervical neck induration..  Eyes:     General:        Right eye: No discharge.  Left eye: No discharge.  Cardiovascular:     Rate and Rhythm: Normal rate and regular rhythm.  Pulmonary:     Effort: Pulmonary effort is normal. No respiratory distress.  Musculoskeletal:        General: No deformity.  Skin:    General: Skin is warm and dry.  Neurological:     Mental Status: He is alert and oriented to person, place, and time.  Psychiatric:        Mood and Affect: Mood normal.        Behavior: Behavior normal.     (all labs ordered are listed, but only abnormal results are displayed) Labs Reviewed - No data to display  EKG: None  Radiology: No results found.   Procedures   Medications Ordered in the ED  dexamethasone  (DECADRON ) 1 MG/ML solution 10 mg (has no administration in time range)                                     Medical Decision Making Risk Prescription drug management.   This is a well-appearing 23yo male who presents with concern for 3 days of sore throat, otalgia.  My emergent differential diagnosis includes acute upper respiratory infection with COVID, flu, RSV versus new asthma presentation, acute bronchitis, less clinical concern for pneumonia.  Also considered other ENT emergencies, Ludwig angina, strep pharyngitis, mono, versus epiglottis, tonsillitis versus other.  High clinical suspicion for strep pharyngitis given patient was already diagnosed with strep pharyngitis. this is not an exhaustive differential.  On my exam patient is overall well-appearing, they have temperature of 98.7, breathing unlabored, no tachypnea, no respiratory distress, stable oxygen saturation.  Patient without tachycardia.  Bilateral TMs with cerumen impaction but otherwise clear.  Posterior oropharynx with significant tonsillar swelling, exudate.  Uvula midline, no floor of mouth swelling, redness, no anterior cervical neck induration..  Patient symptoms are consistent with strep pharyngitis encouraged ibuprofen, Tylenol , rest, plenty of fluids.  Patient encouraged to finish His antibiotics, given 1 dose of Decadron  in the emergency department to help with tonsillar swelling and pain. discussed extensive return precautions.  Patient discharged in stable condition at this time.  Final diagnoses:  Strep pharyngitis  Otalgia of both ears    ED Discharge Orders     None          Rosan Sherlean DEL, [place] 03/21/24 9156    Levander Houston, MD 03/28/24 1128

## 2024-03-21 NOTE — ED Notes (Signed)
 Awaiting decadron  from [facility] prior to d/c

## 2024-04-12 ENCOUNTER — Ambulatory Visit (HOSPITAL_COMMUNITY): Payer: Self-pay | Admitting: Mental Health
